# Patient Record
Sex: Female | Born: 1975
Health system: Southern US, Community
[De-identification: ages and names within clinical notes are randomized; demographics above are authoritative.]

## PROBLEM LIST (undated history)

## (undated) DIAGNOSIS — A630 Anogenital (venereal) warts: Secondary | ICD-10-CM

## (undated) DIAGNOSIS — R55 Syncope and collapse: Secondary | ICD-10-CM

## (undated) DIAGNOSIS — G43909 Migraine, unspecified, not intractable, without status migrainosus: Secondary | ICD-10-CM

## (undated) HISTORY — DX: Syncope and collapse: R55

## (undated) HISTORY — DX: Anogenital (venereal) warts: A63.0

## (undated) HISTORY — DX: Migraine, unspecified, not intractable, without status migrainosus: G43.909

---

## 2000-05-30 ENCOUNTER — Other Ambulatory Visit: Admission: RE | Admit: 2000-05-30 | Discharge: 2000-05-30 | Payer: Self-pay | Admitting: *Deleted

## 2001-06-02 ENCOUNTER — Other Ambulatory Visit: Admission: RE | Admit: 2001-06-02 | Discharge: 2001-06-02 | Payer: Self-pay | Admitting: *Deleted

## 2002-07-10 ENCOUNTER — Other Ambulatory Visit: Admission: RE | Admit: 2002-07-10 | Discharge: 2002-07-10 | Payer: Self-pay | Admitting: Obstetrics & Gynecology

## 2003-07-30 ENCOUNTER — Other Ambulatory Visit: Admission: RE | Admit: 2003-07-30 | Discharge: 2003-07-30 | Payer: Self-pay | Admitting: Obstetrics & Gynecology

## 2006-01-12 ENCOUNTER — Ambulatory Visit (HOSPITAL_COMMUNITY): Payer: Self-pay | Admitting: Psychiatry

## 2006-01-31 ENCOUNTER — Ambulatory Visit (HOSPITAL_COMMUNITY): Payer: Self-pay | Admitting: Psychiatry

## 2006-03-16 ENCOUNTER — Ambulatory Visit (HOSPITAL_COMMUNITY): Payer: Self-pay | Admitting: Psychiatry

## 2008-05-27 IMAGING — CT CT ABDOMEN W/O CM
3 of 7 series · 13 of 42 positions shown, 19 images · IV contrast (CONTRAST)
Comparison: NONE

CLINICAL DATA: Right upper quadrant pain. 

CT ABDOMEN WITHOUT AND WITH INTRAVENOUS CONTRAST
TECHNIQUE: Serial axial images were obtained through the 
abdomen.

[Series 3: arterial · axial · arterial · 0.72mm/px · z∈[+78,+258]mm · 5 of 56 slices shown]
[im 10/56  soft-tissue]
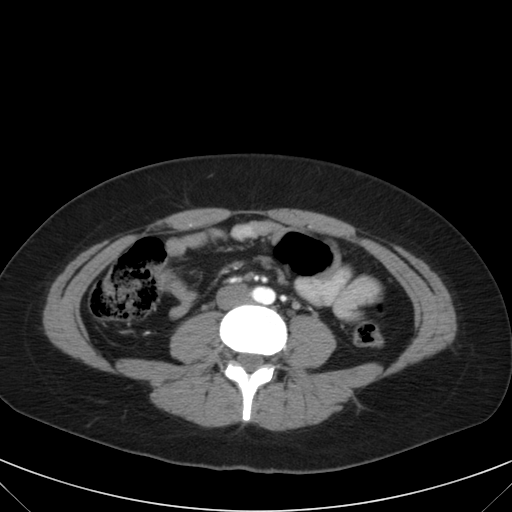
[im 19/56  soft-tissue]
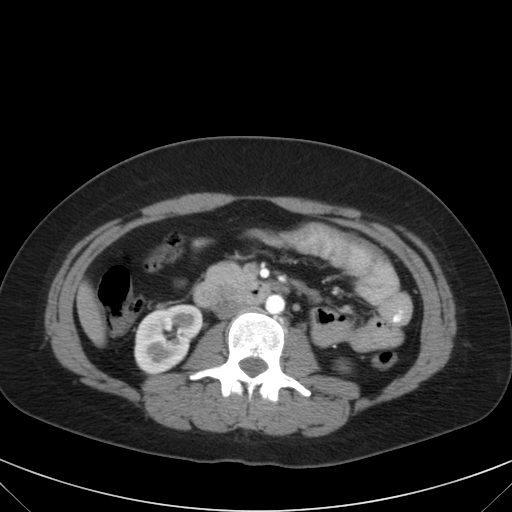
[im 28/56  soft-tissue]
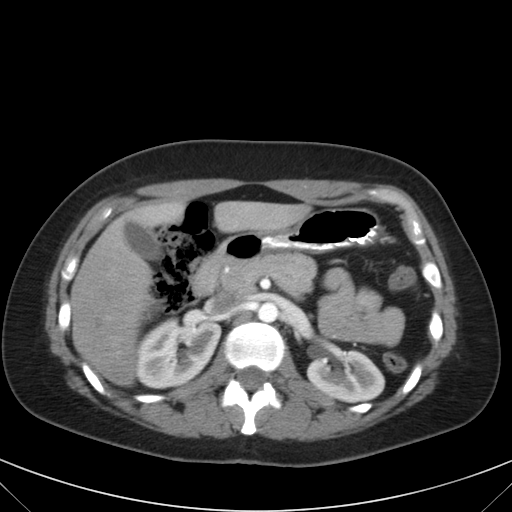
[im 37/56  soft-tissue]
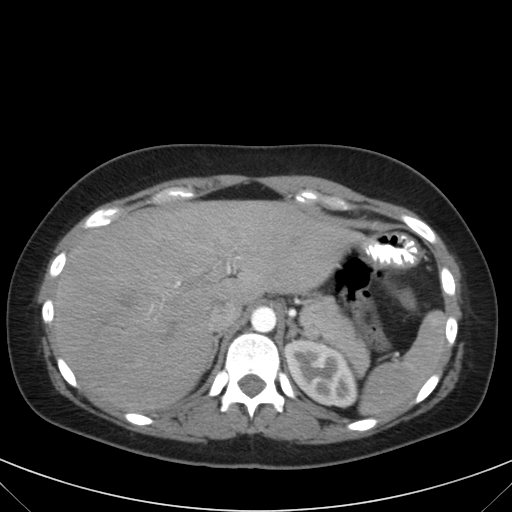
[im 46/56  soft-tissue]
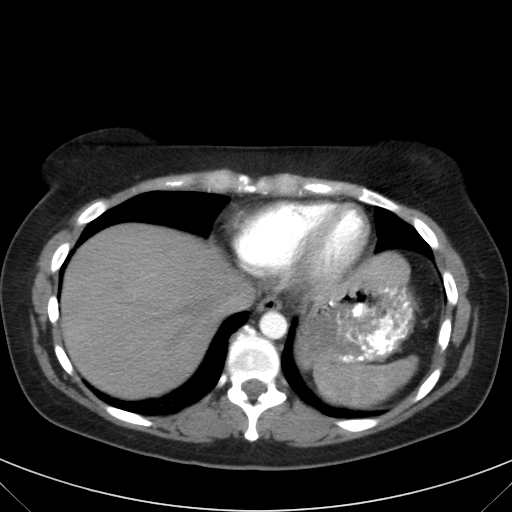

[Series 4: venous · axial · portal-venous · 0.74mm/px · z∈[+76,+256]mm · 5 of 56 slices shown, 10 images]
[im 10/56  soft-tissue]
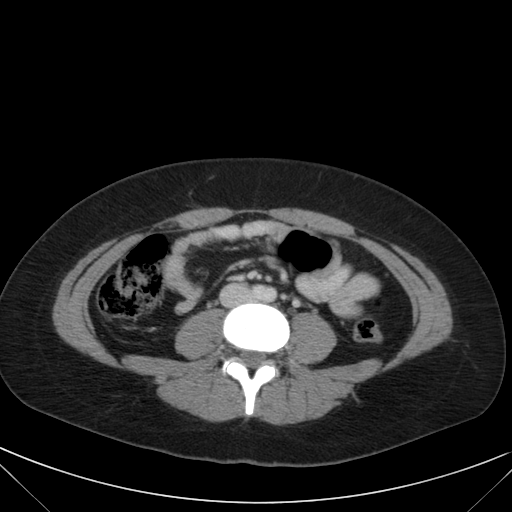
[im 10/56  bone]
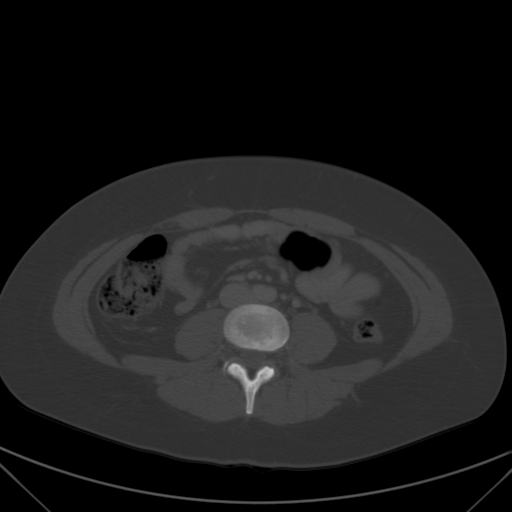
[im 19/56  soft-tissue]
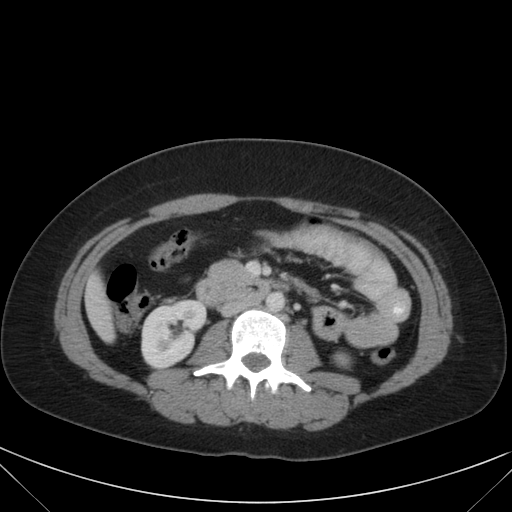
[im 19/56  lung]
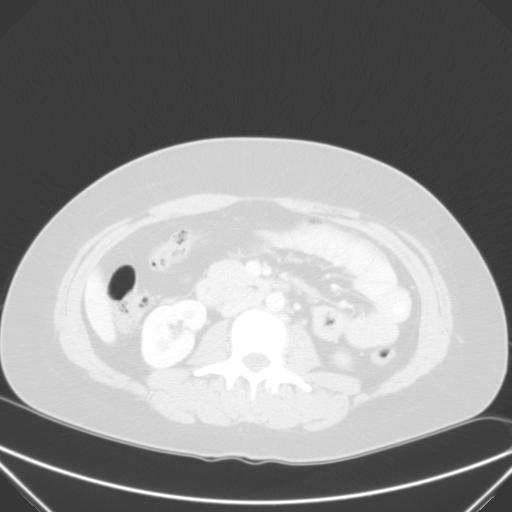
[im 28/56  soft-tissue]
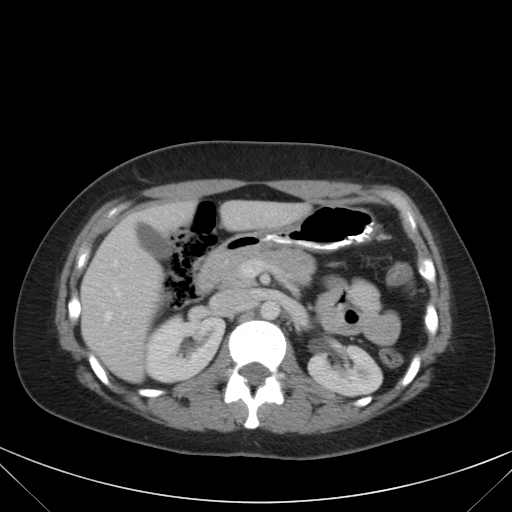
[im 28/56  lung]
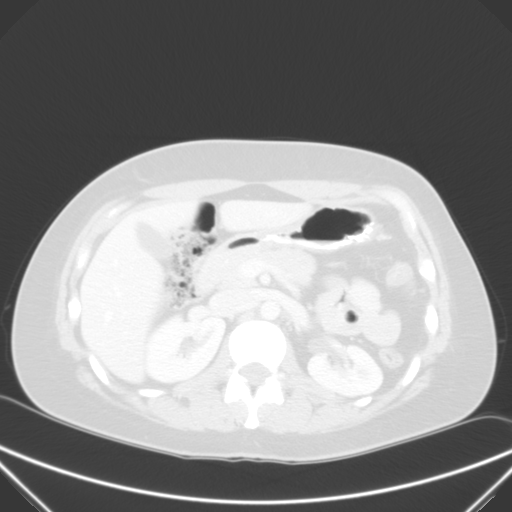
[im 37/56  soft-tissue]
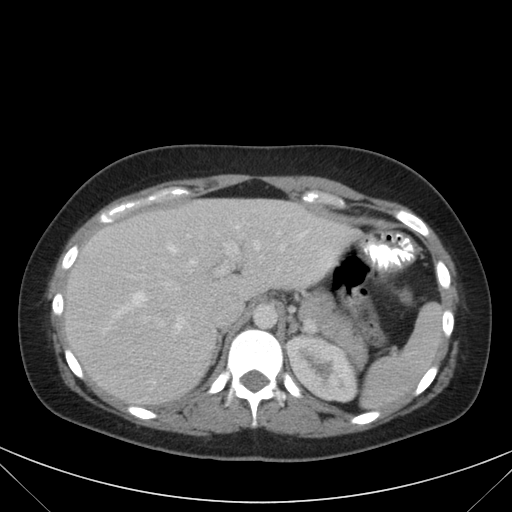
[im 37/56  lung]
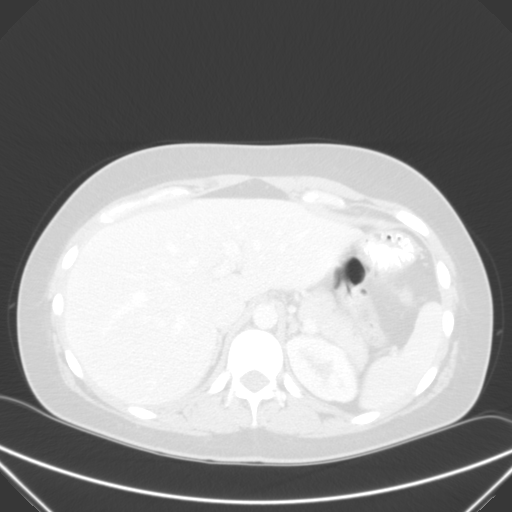
[im 46/56  soft-tissue]
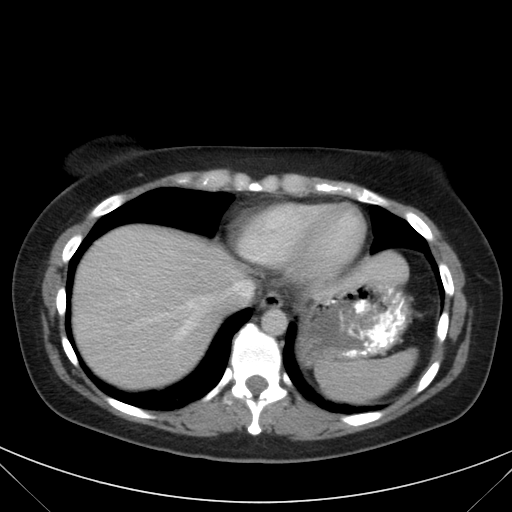
[im 46/56  lung]
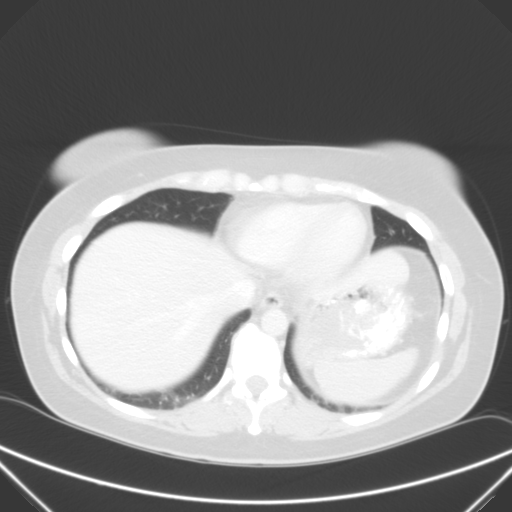

[coronals · coronal · 0.74mm/px · 3 of 62 slices shown, 4 images]
[im 21/62  soft-tissue]
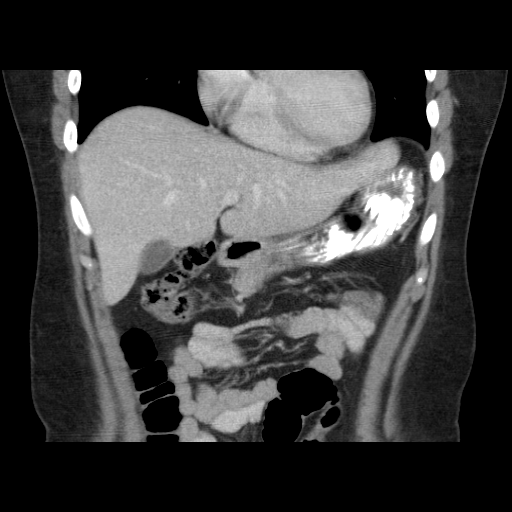
[im 28/62  soft-tissue]
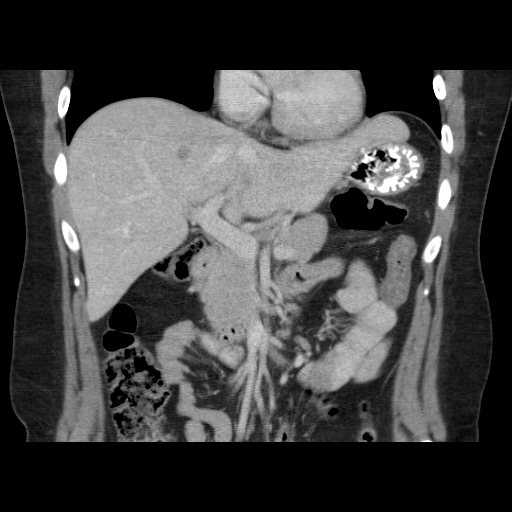
[im 28/62  bone]
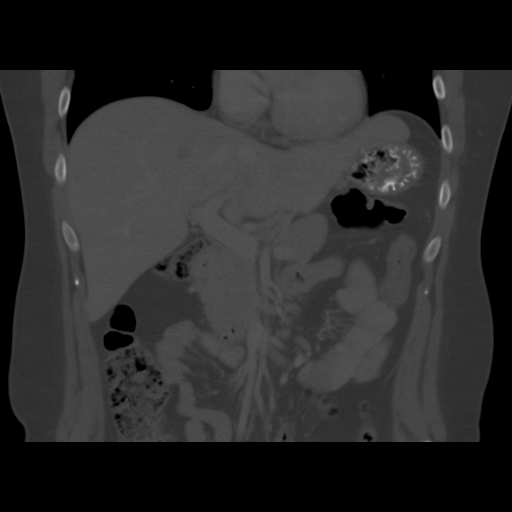
[im 34/62  soft-tissue]
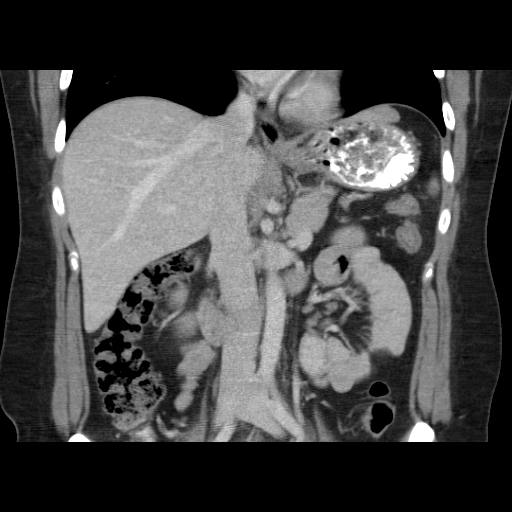

[13 of 42 positions shown; findings below may reference images not displayed]

FINDINGS: The heart size is within normal limits.  The visualized 
portions of the lung bases are within normal limits.  There is a 
1.3-cm-diameter, rounded hypodense lesion seen in the left lobe of 
the liver (image 14).  This is not seen on the delayed contrast 
images and likely represents a cavernous hemangioma.  There is no  
intrahepatic biliary dilatation.  The spleen, gallbladder, 
pancreas, adrenal glands are within normal limits.  Kidneys are 
within normal limits. No evidence of free fluid or aortic 
aneurysm.  No inflammatory changes are noted.  No evidence of 
lymphadenopathy.  No anterior abdominal wall hernia.  No abnormal 
osseous lesions are seen.
IMPRESSION: No evidence of hernia.  No abnormal soft tissue 
density mass is seen. Hypodense lesion seen in the left lobe of 
the liver, which is likely a cavernous hemangioma. Marikko Galling, 
Trans Date: 12/22/2006 JH  JLM

## 2015-08-28 ENCOUNTER — Ambulatory Visit (INDEPENDENT_AMBULATORY_CARE_PROVIDER_SITE_OTHER): Payer: Federal, State, Local not specified - PPO | Admitting: Primary Care

## 2015-08-28 ENCOUNTER — Encounter: Payer: Self-pay | Admitting: Primary Care

## 2015-08-28 VITALS — BP 124/84 | HR 77 | Temp 98.2°F | Ht 66.0 in | Wt 206.4 lb

## 2015-08-28 DIAGNOSIS — F329 Major depressive disorder, single episode, unspecified: Secondary | ICD-10-CM | POA: Insufficient documentation

## 2015-08-28 DIAGNOSIS — R5383 Other fatigue: Secondary | ICD-10-CM

## 2015-08-28 DIAGNOSIS — R51 Headache: Secondary | ICD-10-CM

## 2015-08-28 DIAGNOSIS — R519 Headache, unspecified: Secondary | ICD-10-CM

## 2015-08-28 DIAGNOSIS — F32A Depression, unspecified: Secondary | ICD-10-CM | POA: Insufficient documentation

## 2015-08-28 LAB — CBC
HCT: 45.1 % (ref 36.0–46.0)
Hemoglobin: 15.2 g/dL — ABNORMAL HIGH (ref 12.0–15.0)
MCHC: 33.7 g/dL (ref 30.0–36.0)
MCV: 87.5 fl (ref 78.0–100.0)
PLATELETS: 310 10*3/uL (ref 150.0–400.0)
RBC: 5.15 Mil/uL — ABNORMAL HIGH (ref 3.87–5.11)
RDW: 13.7 % (ref 11.5–15.5)
WBC: 11 10*3/uL — AB (ref 4.0–10.5)

## 2015-08-28 LAB — COMPREHENSIVE METABOLIC PANEL
ALBUMIN: 4.4 g/dL (ref 3.5–5.2)
ALK PHOS: 63 U/L (ref 39–117)
ALT: 14 U/L (ref 0–35)
AST: 15 U/L (ref 0–37)
BUN: 12 mg/dL (ref 6–23)
CO2: 27 mEq/L (ref 19–32)
Calcium: 9.3 mg/dL (ref 8.4–10.5)
Chloride: 105 mEq/L (ref 96–112)
Creatinine, Ser: 0.81 mg/dL (ref 0.40–1.20)
GFR: 83.1 mL/min (ref >60.00)
GLUCOSE: 87 mg/dL (ref 70–99)
POTASSIUM: 4.3 meq/L (ref 3.5–5.1)
Sodium: 137 mEq/L (ref 135–145)
TOTAL PROTEIN: 7.3 g/dL (ref 6.0–8.3)
Total Bilirubin: 0.5 mg/dL (ref 0.2–1.2)

## 2015-08-28 LAB — VITAMIN D 25 HYDROXY (VIT D DEFICIENCY, FRACTURES): VITD: 22.84 ng/mL — AB (ref 30.00–100.00)

## 2015-08-28 LAB — HEMOGLOBIN A1C: Hgb A1c MFr Bld: 5.6 % (ref 4.6–6.5)

## 2015-08-28 LAB — VITAMIN B12: Vitamin B-12: 216 pg/mL (ref 211–911)

## 2015-08-28 LAB — TSH: TSH: 2.89 u[IU]/mL (ref 0.35–4.50)

## 2015-08-28 NOTE — Progress Notes (Signed)
Pre visit review using our clinic review tool, if applicable. No additional management support is needed unless otherwise documented below in the visit note. 

## 2015-08-28 NOTE — Assessment & Plan Note (Signed)
Ongoing for years, no recent work up. Once treated on Wellbutrin "to increase energy levels". Unsure if mother has thyroid disease, does not know much about FH. Exam today unremarkable. Given symptoms, will order labs to rule out metabolic causes. Also suspect depression which could likely be cause if labs normal. PHQ 9 score of 9 today. Denies SI/HI. Discussed options for treatment, she will think about options.

## 2015-08-28 NOTE — Progress Notes (Signed)
Subjective:    Patient ID: Heather Hutchinson, female    DOB: January 09, 1976, 40 y.o.   MRN: [id number]  HPI  Ms. Heather Hutchinson is a 40 year old female who presents today to establish care and discuss the problems mentioned below. Will obtain old records. Her last physical was numerous years ago.   1) Fatigue: Ongoing for years and once managed on Wellbutrin in the past to help increase energy levels. She feels like she could always sleep. She will run errands and feel exhausted, requiring naps during the weekend. She will get 7 hours of sleep every night and night and denies insomnia. She also reports hair thinning, difficulty losing weight, and cold intolerance. He believes her mother has "thyroid problems" but is uncertain. Overall her symptoms are no worse, but no better.   She also feels as though she's not excited to do anything she once enjoyed doing, and has felt this way for years. She has no prior diagnosis of depression, once diagnosed with OCD and provided with medication in the past. She felt the medication made her feel tired and more fatigued. PHQ 9 score of 9 today. Denies anxiety, SI/HI.  2) Near Syncope: Will experience weakness, fatigue, and clammy if she goes longer than 4 hours without eating. She had 1 episode of syncope 20 years ago. No recent episodes.   3) Frequent Headaches: Chronic for years. Headaches occur mostly on the weekends, less frequently overall than before. Typically located to the bilateral temporal region. Denies nausea, does report photophobia. She will take ibuprofen as needed for headaches with improvement.    Review of Systems  Constitutional: Positive for fatigue. Negative for unexpected weight change.  Respiratory: Negative for shortness of breath.   Cardiovascular: Negative for chest pain.  Genitourinary:       Follows with GYN annually  Neurological: Negative for dizziness and syncope.       Occasional headaches  Psychiatric/Behavioral:       See HPI        Past Medical History  Diagnosis Date  . Migraines   . Syncope   . Genital warts      Social History   Social History  . Marital Status: Single    Spouse Name: N/A  . Number of Children: N/A  . Years of Education: N/A   Occupational History  . Not on file.   Social History Main Topics  . Smoking status: Current Every Day Smoker -- 0.50 packs/day    Types: Cigarettes  . Smokeless tobacco: Not on file  . Alcohol Use: No  . Drug Use: No  . Sexual Activity: Not on file   Other Topics Concern  . Not on file   Social History Narrative   Single.   No children.   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   Enjoys spending time with her dogs.        No past surgical history on file.  Family History  Problem Relation Age of Onset  . Sudden death Brother     No Known Allergies  No current outpatient prescriptions on file prior to visit.   No current facility-administered medications on file prior to visit.    BP 124/84 mmHg  Pulse 77  Temp(Src) 98.2 F (36.8 C) (Oral)  Ht 5\' 6"  (1.676 m)  Wt 206 lb 6.4 oz (93.622 kg)  BMI 33.33 kg/m2  SpO2 98%  LMP 08/18/2015    Objective:   Physical Exam  Constitutional: She appears well-nourished.  Neck: Neck  supple.  Cardiovascular: Normal rate and regular rhythm.   Pulmonary/Chest: Effort normal and breath sounds normal.  Skin: Skin is warm and dry.  Psychiatric: She has a normal mood and affect.  Mild tearfulness when discussing possible depression          Assessment & Plan:

## 2015-08-28 NOTE — Assessment & Plan Note (Signed)
Chronic, overall improved. Occur during weekends to bilateral temporal region. Improvement with ibuprofen. No migraines.

## 2015-08-28 NOTE — Patient Instructions (Signed)
Complete lab work prior to leaving today. I will notify you of your results once received.   I believe you may have a mild case of depression. Think about the options discussed today and notify me if you'd like to pursue treatment.  It was a pleasure to meet you today! Please don't hesitate to call me with any questions. Welcome to [facility]!

## 2015-09-16 ENCOUNTER — Telehealth: Payer: Self-pay | Admitting: *Deleted

## 2015-09-16 DIAGNOSIS — F32A Depression, unspecified: Secondary | ICD-10-CM

## 2015-09-16 DIAGNOSIS — F329 Major depressive disorder, single episode, unspecified: Secondary | ICD-10-CM

## 2015-09-16 MED ORDER — SERTRALINE HCL 50 MG PO TABS: 50.0000 mg | ORAL_TABLET | Freq: Every day | ORAL | Status: DC

## 2015-09-16 NOTE — Telephone Encounter (Signed)
Noted. A prescription for Zoloft has been sent to her pharmacy. She is to start out by taking one half tablet by mouth twice daily for 10 days, then advance to one full tablet thereafter. Please ensure that she follows those instructions.  Please set her up for follow-up with me in 6 weeks for reevaluation. Please have her notify me if she develops symptoms of suicidal thoughts which are rare but could occur.

## 2015-09-16 NOTE — Telephone Encounter (Signed)
Patient left a voicemail stating that she was in about a month ago and you talked with her about starting Zoloft. Patient stated that she has done some research on this and would like a script called to [facility] Patient stated that she needs to pick something else up at the pharmacy and request a call back when this has been done.

## 2015-09-16 NOTE — Telephone Encounter (Signed)
Spoken and notified patient of [name]'s comments. Patient verbalized understanding.  Patient will call back to schedule to 6 weeks follow up.

## 2015-09-25 NOTE — Telephone Encounter (Addendum)
Pt left v/m; pt started taking Zoloft 50 mg 1/2 tab bid on 09/17/15; pt said she had bad h/as, dry mouth and foggy feeling and feels tired. On 09/20/15 pt started taking Zoloft 50 mg 1/2 pill at night; some symptoms better but this morning had slight h/a, still feels foggy and dry mouth and feels tired. Pt wants to know if should continue taking 1/2 pill daily or what to do. Pt request cb. [facility].

## 2015-09-25 NOTE — Telephone Encounter (Signed)
Please have patient only take one half tablet every night at bedtime. Please have her do this for another week and notify me if no improvement. The headache should subside.

## 2015-09-25 NOTE — Telephone Encounter (Signed)
Spoken and notified patient of [name]'s comments. Patient verbalized understanding. 

## 2015-11-05 DIAGNOSIS — Z23 Encounter for immunization: Secondary | ICD-10-CM | POA: Diagnosis not present

## 2015-11-05 DIAGNOSIS — S61230A Puncture wound without foreign body of right index finger without damage to nail, initial encounter: Secondary | ICD-10-CM | POA: Diagnosis not present

## 2015-12-10 ENCOUNTER — Other Ambulatory Visit: Payer: Self-pay | Admitting: Primary Care

## 2015-12-10 DIAGNOSIS — F329 Major depressive disorder, single episode, unspecified: Secondary | ICD-10-CM

## 2015-12-10 DIAGNOSIS — F32A Depression, unspecified: Secondary | ICD-10-CM

## 2015-12-10 NOTE — Telephone Encounter (Signed)
Ok to refill? Electronically refill request for sertraline (ZOLOFT) 50 MG tablet.  Last prescribed on 09/16/2015. Last seen on 08/28/2015. No future appt.

## 2015-12-11 NOTE — Telephone Encounter (Signed)
Message left for patient to return my call.  

## 2015-12-12 NOTE — Telephone Encounter (Signed)
Message left for patient to return my call.  

## 2015-12-30 DIAGNOSIS — J00 Acute nasopharyngitis [common cold]: Secondary | ICD-10-CM | POA: Diagnosis not present

## 2016-01-02 ENCOUNTER — Ambulatory Visit (INDEPENDENT_AMBULATORY_CARE_PROVIDER_SITE_OTHER): Payer: Federal, State, Local not specified - PPO | Admitting: Primary Care

## 2016-01-02 ENCOUNTER — Encounter: Payer: Self-pay | Admitting: Primary Care

## 2016-01-02 DIAGNOSIS — F329 Major depressive disorder, single episode, unspecified: Secondary | ICD-10-CM | POA: Diagnosis not present

## 2016-01-02 DIAGNOSIS — F32A Depression, unspecified: Secondary | ICD-10-CM

## 2016-01-02 MED ORDER — SERTRALINE HCL 50 MG PO TABS: 50.0000 mg | ORAL_TABLET | Freq: Every day | ORAL | 3 refills | Status: DC

## 2016-01-02 NOTE — Progress Notes (Signed)
Pre visit review using our clinic review tool, if applicable. No additional management support is needed unless otherwise documented below in the visit note. 

## 2016-01-02 NOTE — Progress Notes (Signed)
Subjective:    Patient ID: Heather Hutchinson, female    DOB: [birth year], 40 y.o.   MRN: [id number]  HPI  Heather Hutchinson is a 40 year old female who presents today for follow up.  1) Nasal Congestion: She was evaluated at Urgent Care on Tuesday this week for complaints of cough, chest and nasal congestion, ear pain. Her symptoms began Monday this week. She was treated for a viral URI and provided with Benzonotate capsules. Overall she's feeling much improved but would like evaluation of her ear as they have been painful over the past several days.  2) Depression: Managed on Zoloft 25 mg that was initiated in May 2017 after further evaluation of symptoms of fatigue, decreased interest in doing things she enjoyed. Her PHQ 9 score was 9 that day. She was reticent to go on medication but called back and decided to move forward with treatment. She was placed on Zoloft 50 mg for symptoms.    Since her last visit she's been taking 25 mg due to side effects of headaches, feeling "odd and out of my head", but those symptoms have since passed and she will be increasing her dose to 1 full tablet son. She's noticed improvements in her mood, she is more interested in doing activities she enjoys doing. She and her husband are camping more frequently. Denies SI/HI, headaches, Gi upset.   Review of Systems  Constitutional: Negative for chills and fever.  HENT: Positive for congestion and ear pain. Negative for sore throat.   Respiratory: Negative for cough and shortness of breath.   Gastrointestinal: Negative for nausea.  Neurological: Negative for headaches.  Psychiatric/Behavioral: Negative for sleep disturbance and suicidal ideas. The patient is not nervous/anxious.        Past Medical History:  Diagnosis Date  . Genital warts   . Migraines   . Syncope      Social History   Social History  . Marital status: Single    Spouse name: N/A  . Number of children: N/A  . Years of education: N/A    Occupational History  . Not on file.   Social History Main Topics  . Smoking status: Current Every Day Smoker    Packs/day: 0.50    Types: Cigarettes  . Smokeless tobacco: Not on file  . Alcohol use No  . Drug use: No  . Sexual activity: Not on file   Other Topics Concern  . Not on file   Social History Narrative   Single.   No children.   Works in IT trainer.   Enjoys spending time with her dogs.        No past surgical history on file.  Family History  Problem Relation Age of Onset  . Sudden death Brother     No Known Allergies  Current Outpatient Prescriptions on File Prior to Visit  Medication Sig Dispense Refill  . CAMILA 0.35 MG tablet Take 1 tablet by mouth daily.  1   No current facility-administered medications on file prior to visit.     BP 124/86   Pulse 74   Temp 98.3 F (36.8 C) (Oral)   Ht 5\' 6"  (1.676 m)   Wt 203 lb 1.9 oz (92.1 kg)   LMP 12/12/2015   SpO2 96%   BMI 32.78 kg/m    Objective:   Physical Exam  Constitutional: She appears well-nourished.  HENT:  Right Ear: Ear canal normal. Tympanic membrane is not erythematous and not bulging.  Left Ear:  Ear canal normal. Tympanic membrane is not erythematous and not bulging.  Nose: Right sinus exhibits no maxillary sinus tenderness and no frontal sinus tenderness. Left sinus exhibits no maxillary sinus tenderness and no frontal sinus tenderness.  Mouth/Throat: Oropharynx is clear and moist.  Mild effusion to bilateral TM's. No erythema or bulging.  Eyes: Conjunctivae are normal.  Neck: Neck supple.  Cardiovascular: Normal rate and regular rhythm.   Pulmonary/Chest: Effort normal and breath sounds normal. She has no wheezes. She has no rales.  Lymphadenopathy:    She has no cervical adenopathy.  Skin: Skin is warm and dry.  Psychiatric: She has a normal mood and affect.          Assessment & Plan:  Viral URI:  Cough, fatigue, ear pain, congestion since Monday this  week. Overall feeling much improved. Benzonatate capsules helping. Exam mostly unremarkable. Lungs clear. Mild effusion to bilateral TM's. Continue Benzonatate PRN.   Morrie Sheldonlark,Katherine Kendal, NP

## 2016-01-02 NOTE — Assessment & Plan Note (Addendum)
Improved with Zoloft.  Taking 1/2 tablet due to s/e of headaches, but will be increasing to 1 full tablet now that symptoms have resolved. Much improved since last visit. Denies SI/HI. Continue same.

## 2016-01-02 NOTE — Patient Instructions (Signed)
I sent refills of Zoloft to your pharmacy.   Please schedule a physical with me in 6 months. You may also schedule a lab only appointment 3-4 days prior. We will discuss your lab results in detail during your physical.  It was a pleasure to see you today!

## 2016-01-28 DIAGNOSIS — Z23 Encounter for immunization: Secondary | ICD-10-CM | POA: Diagnosis not present

## 2016-03-24 ENCOUNTER — Telehealth: Payer: Self-pay | Admitting: Primary Care

## 2016-03-24 NOTE — Telephone Encounter (Signed)
Noted and agree with triage decision.

## 2016-03-24 NOTE — Telephone Encounter (Signed)
PLEASE NOTE: All timestamps contained within this report are represented as [place] Time. CONFIDENTIALTY NOTICE: This fax transmission is intended only for the addressee. It contains information that is legally privileged, confidential or otherwise protected from use or disclosure. If you are not the intended recipient, you are strictly prohibited from reviewing, disclosing, copying using or disseminating any of this information or taking any action in reliance on or regarding this information. If you have received this fax in error, please notify us immediately by telephone so that we can arrange for its return to us. Phone: [phone], Toll-Free: [phone], Fax: [phone] Page: 1 of 2 Call Id: [id number] [facility] - Client TELEPHONE ADVICE RECORD [facility] Gender: Female DOB: 11-08-1975 Age: [age] Y 10 M 21 D Return Phone Number: [phone] (Primary) Address: [place]: [place] [id number] Client [facility] Day Physician Vernona Riegerlark, Katherine - NP Contact Type Call Who Is Calling Patient / Member / Family / Caregiver Call Type Triage / Clinical Relationship To Patient Self Return Phone Number [phone] (Primary) Chief Complaint Arm Pain (no known cause) Reason for Call Symptomatic / Request for Health Information Initial Comment Caller says, her RT arm has a sudden sharp pain today while working, this lasted for 45 min, then has gotten better, it is still sore. Appointment Disposition EMR Appointment Not Necessary Info pasted into Epic Yes PreDisposition Call Doctor Translation No Nurse Assessment Nurse: Lane HackerHarley, RN, Elvin SoWindy Date/Time Lamount Cohen(Eastern Time): 03/24/2016 1:21:15 PM Confirm and document reason for call. If symptomatic, describe symptoms. ---Caller states that her right arm has a sudden sharp pain  today while working, this lasted for 45 min. Felt with moving her mouse, or lifting her arm up. The has gotten better, it is still sore. Rates pain now at 1/10. Does the patient have any new or worsening symptoms? ---Yes Will a triage be completed? ---Yes Related visit to physician within the last 2 weeks? ---No Does the PT have any chronic conditions? (i.e. diabetes, asthma, etc.) ---No Is the patient pregnant or possibly pregnant? (Ask all females between the ages of 7612-55) ---No Is this a behavioral health or substance abuse call? ---No Guidelines Guideline Title Affirmed Question Affirmed Notes Nurse Date/Time Lamount Cohen(Eastern Time) Arm Pain Arm pain (all triage questions negative) Lane HackerHarley, RN, Elvin SoWindy 03/24/2016 1:23:24 PM Disp. Time Lamount Cohen(Eastern Time) Disposition Final User 03/24/2016 1:27:19 PM Home Care Yes Lane HackerHarley, RN, [place]indy PLEASE NOTE: All timestamps contained within this report are represented as [place] Standard Time. CONFIDENTIALTY NOTICE: This fax transmission is intended only for the addressee. It contains information that is legally privileged, confidential or otherwise protected from use or disclosure. If you are not the intended recipient, you are strictly prohibited from reviewing, disclosing, copying using or disseminating any of this information or taking any action in reliance on or regarding this information. If you have received this fax in error, please notify us immediately by telephone so that we can arrange for its return to us. Phone: [phone], Toll-Free: [phone], Fax: [phone] Page: 2 of 2 Call Id: [id number] Caller Understands: Yes Disagree/Comply: Comply Care Advice Given Per Guideline HOME CARE: You should be able to treat this at home. PAIN MEDICINES: * For pain relief, take acetaminophen, ibuprofen, or naproxen. * Before taking any medicine, read all the instructions on the package. CALL BACK IF: * Moderate pain (e.g., interferes with normal  activities) lasts over 3  days * Mild pain lasts over 7 days * Arm swelling occurs * Signs of infection occur (e.g., spreading redness, warmth, fever) * You become worse. CARE ADVICE given per Arm Pain (Adult) guideline. Comments User: Rubie MaidWindy, Harley, RN Date/Time Lamount Cohen(Eastern Time): 03/24/2016 1:30:38 PM RN advised speaking to supervisor in case this is work related. Caller verb understanding.

## 2016-03-24 NOTE — Telephone Encounter (Signed)
[facility] Client TELEPHONE ADVICE RECORD [facility] DOB: [birth year] Initial Comment Caller says, her RT arm has a sudden sharp pain today while working, this lasted for 45 min, then has gotten better, it is still sore. Nurse Assessment Nurse: Lane HackerHarley, RN, Elvin SoWindy Date/Time (Eastern Time): 03/24/2016 1:21:15 PM Confirm and document reason for call. If symptomatic, describe symptoms. ---Caller states that her right arm has a sudden sharp pain today while working, this lasted for 45 min. Felt with moving her mouse, or lifting her arm up. The has gotten better, it is still sore. Rates pain now at 1/10. Does the patient have any new or worsening symptoms? ---Yes Will a triage be completed? ---Yes Related visit to physician within the last 2 weeks? ---No Does the PT have any chronic conditions? (i.e. diabetes, asthma, etc.) ---No Is the patient pregnant or possibly pregnant? (Ask all females between the ages of 3012-55) ---No Is this a behavioral health or substance abuse call? ---No Guidelines Guideline Title Affirmed Question Affirmed Notes Arm Pain Arm pain (all triage questions negative) Final Disposition User Home Care SardisHarley, RN, Elvin SoWindy Disagree/Comply: Comply RN advised speaking to supervisor in case this is work related.

## 2016-07-08 ENCOUNTER — Telehealth: Payer: Federal, State, Local not specified - PPO | Admitting: Physician Assistant

## 2016-07-08 DIAGNOSIS — J329 Chronic sinusitis, unspecified: Secondary | ICD-10-CM

## 2016-07-08 DIAGNOSIS — B9789 Other viral agents as the cause of diseases classified elsewhere: Secondary | ICD-10-CM

## 2016-07-08 MED ORDER — FLUTICASONE PROPIONATE 50 MCG/ACT NA SUSP: 2.0000 | Freq: Every day | NASAL | 0 refills | Status: DC

## 2016-07-08 NOTE — Progress Notes (Signed)

## 2016-08-06 ENCOUNTER — Encounter: Payer: Self-pay | Admitting: Internal Medicine

## 2016-08-06 ENCOUNTER — Ambulatory Visit (INDEPENDENT_AMBULATORY_CARE_PROVIDER_SITE_OTHER): Payer: Federal, State, Local not specified - PPO | Admitting: Internal Medicine

## 2016-08-06 VITALS — BP 122/78 | HR 72 | Temp 98.1°F | Wt 216.0 lb

## 2016-08-06 DIAGNOSIS — L02415 Cutaneous abscess of right lower limb: Secondary | ICD-10-CM | POA: Diagnosis not present

## 2016-08-06 MED ORDER — SULFAMETHOXAZOLE-TRIMETHOPRIM 800-160 MG PO TABS: 1.0000 | ORAL_TABLET | Freq: Two times a day (BID) | ORAL | 0 refills | Status: DC

## 2016-08-06 NOTE — Patient Instructions (Signed)

## 2016-08-06 NOTE — Progress Notes (Signed)
Subjective:    Patient ID: Heather Hutchinson, female    DOB: 1975-08-04, 41 y.o.   MRN: [id number]  HPI  Pt presents to the clinic today with c/o a bump on her inner thigh. She noticed this 1 month ago. It was big at one point, then resolved. Within the last 4-5 days, it has gotten bigger in size. The area is hard but tender. She has not noticed any warmth or drainage from the area. She denies fever, chills or body aches. She has tried warm compresses with some relief.   Review of Systems      Past Medical History:  Diagnosis Date  . Genital warts   . Migraines   . Syncope     Current Outpatient Prescriptions  Medication Sig Dispense Refill  . CAMILA 0.35 MG tablet Take 1 tablet by mouth daily.  1  . fluticasone (FLONASE) 50 MCG/ACT nasal spray Place 2 sprays into both nostrils daily. 16 g 0  . sertraline (ZOLOFT) 50 MG tablet Take 1 tablet (50 mg total) by mouth daily. 90 tablet 3   No current facility-administered medications for this visit.     No Known Allergies  Family History  Problem Relation Age of Onset  . Sudden death Brother     Social History   Social History  . Marital status: Single    Spouse name: N/A  . Number of children: N/A  . Years of education: N/A   Occupational History  . Not on file.   Social History Main Topics  . Smoking status: Current Every Day Smoker    Packs/day: 0.50    Types: Cigarettes  . Smokeless tobacco: Not on file  . Alcohol use No  . Drug use: No  . Sexual activity: Not on file   Other Topics Concern  . Not on file   Social History Narrative   Single.   No children.   Works in IT trainer.   Enjoys spending time with her dogs.         Constitutional: Denies fever, malaise, fatigue, headache or abrupt weight changes.  Skin: Pt reports bump of right thigh. Denies  rashes or ulcercations.    No other specific complaints in a complete review of systems (except as listed in HPI above).  Objective:   Physical Exam  BP 122/78   Pulse 72   Temp 98.1 F (36.7 C) (Oral)   Wt 216 lb (98 kg)   SpO2 97%   BMI 34.86 kg/m  Wt Readings from Last 3 Encounters:  08/06/16 216 lb (98 kg)  01/02/16 203 lb 1.9 oz (92.1 kg)  08/28/15 206 lb 6.4 oz (93.6 kg)    General: Appears her stated age,  in NAD. Skin: 3 cm by 1.5 cm area of induration. Area is hard, not fluctuant. No cellulitis noted.   BMET    Component Value Date/Time   NA 137 08/28/2015 0908   K 4.3 08/28/2015 0908   CL 105 08/28/2015 0908   CO2 27 08/28/2015 0908   GLUCOSE 87 08/28/2015 0908   BUN 12 08/28/2015 0908   CREATININE 0.81 08/28/2015 0908   CALCIUM 9.3 08/28/2015 0908    Lipid Panel  No results found for: CHOL, TRIG, HDL, CHOLHDL, VLDL, LDLCALC  CBC    Component Value Date/Time   WBC 11.0 (H) 08/28/2015 0908   RBC 5.15 (H) 08/28/2015 0908   HGB 15.2 (H) 08/28/2015 0908   HCT 45.1 08/28/2015 0908   PLT 310.0 08/28/2015 0908  MCV 87.5 08/28/2015 0908   MCHC 33.7 08/28/2015 0908   RDW 13.7 08/28/2015 0908    Hgb A1C Lab Results  Component Value Date   HGBA1C 5.6 08/28/2015            Assessment & Plan:   Abscess of Right Thigh:  Warm compresses TID Septra DS 1 tab BID x 10 days  Return precautions discussed Nicki Reaper, NP

## 2016-08-09 ENCOUNTER — Encounter: Payer: Self-pay | Admitting: Internal Medicine

## 2016-08-13 DIAGNOSIS — Z1231 Encounter for screening mammogram for malignant neoplasm of breast: Secondary | ICD-10-CM | POA: Diagnosis not present

## 2016-08-13 DIAGNOSIS — Z01419 Encounter for gynecological examination (general) (routine) without abnormal findings: Secondary | ICD-10-CM | POA: Diagnosis not present

## 2016-08-13 DIAGNOSIS — Z6835 Body mass index (BMI) 35.0-35.9, adult: Secondary | ICD-10-CM | POA: Diagnosis not present

## 2016-10-27 ENCOUNTER — Other Ambulatory Visit: Payer: Self-pay | Admitting: Primary Care

## 2016-10-27 DIAGNOSIS — E559 Vitamin D deficiency, unspecified: Secondary | ICD-10-CM

## 2016-10-27 DIAGNOSIS — Z0000 Encounter for general adult medical examination without abnormal findings: Secondary | ICD-10-CM

## 2016-10-27 DIAGNOSIS — E538 Deficiency of other specified B group vitamins: Secondary | ICD-10-CM

## 2016-11-05 ENCOUNTER — Other Ambulatory Visit (INDEPENDENT_AMBULATORY_CARE_PROVIDER_SITE_OTHER): Payer: Federal, State, Local not specified - PPO

## 2016-11-05 DIAGNOSIS — E538 Deficiency of other specified B group vitamins: Secondary | ICD-10-CM | POA: Diagnosis not present

## 2016-11-05 DIAGNOSIS — E559 Vitamin D deficiency, unspecified: Secondary | ICD-10-CM | POA: Diagnosis not present

## 2016-11-05 DIAGNOSIS — Z0000 Encounter for general adult medical examination without abnormal findings: Secondary | ICD-10-CM

## 2016-11-05 LAB — LIPID PANEL
CHOL/HDL RATIO: 6
Cholesterol: 163 mg/dL (ref 0–200)
HDL: 28.2 mg/dL — AB (ref >39.00)
LDL CALC: 102 mg/dL — AB (ref 0–99)
NONHDL: 134.92
TRIGLYCERIDES: 164 mg/dL — AB (ref 0.0–149.0)
VLDL: 32.8 mg/dL (ref 0.0–40.0)

## 2016-11-05 LAB — VITAMIN D 25 HYDROXY (VIT D DEFICIENCY, FRACTURES): VITD: 39.86 ng/mL (ref 30.00–100.00)

## 2016-11-05 LAB — VITAMIN B12: Vitamin B-12: 334 pg/mL (ref 211–911)

## 2016-11-09 ENCOUNTER — Ambulatory Visit (INDEPENDENT_AMBULATORY_CARE_PROVIDER_SITE_OTHER): Payer: Federal, State, Local not specified - PPO | Admitting: Primary Care

## 2016-11-09 ENCOUNTER — Encounter: Payer: Self-pay | Admitting: Primary Care

## 2016-11-09 ENCOUNTER — Other Ambulatory Visit: Payer: Federal, State, Local not specified - PPO

## 2016-11-09 VITALS — BP 122/80 | HR 82 | Temp 98.1°F | Ht 66.0 in | Wt 213.1 lb

## 2016-11-09 DIAGNOSIS — L0291 Cutaneous abscess, unspecified: Secondary | ICD-10-CM | POA: Diagnosis not present

## 2016-11-09 DIAGNOSIS — Z0001 Encounter for general adult medical examination with abnormal findings: Secondary | ICD-10-CM | POA: Insufficient documentation

## 2016-11-09 DIAGNOSIS — F3342 Major depressive disorder, recurrent, in full remission: Secondary | ICD-10-CM

## 2016-11-09 DIAGNOSIS — R1011 Right upper quadrant pain: Secondary | ICD-10-CM | POA: Diagnosis not present

## 2016-11-09 DIAGNOSIS — R2243 Localized swelling, mass and lump, lower limb, bilateral: Secondary | ICD-10-CM | POA: Diagnosis not present

## 2016-11-09 DIAGNOSIS — Z0000 Encounter for general adult medical examination without abnormal findings: Secondary | ICD-10-CM | POA: Insufficient documentation

## 2016-11-09 MED ORDER — SULFAMETHOXAZOLE-TRIMETHOPRIM 800-160 MG PO TABS: 1.0000 | ORAL_TABLET | Freq: Two times a day (BID) | ORAL | 0 refills | Status: DC

## 2016-11-09 NOTE — Progress Notes (Signed)
Subjective:    Patient ID: Heather Hutchinson, female    DOB: 04-Mar-1976, 41 y.o.   MRN: [id number]  HPI  Heather Hutchinson is a 41 year old female who presents today for complete physical and for the problems mentioned below.  1) Abscess: Treated in April 2018. Abscess improved but never went away. Worse over the past 2-3 weeks, noticed whitish green drainage. Located to right medial upper thigh. She denies fevers, pain.   2) Abdominal Pain: Intermittent to RUQ for years. CT scan 10 years ago revealed liver mass. Pain will come in flares that will last 3-4 days, flares occur every 1-2 months. This occurs with rest mostly. Denies symptoms with eating, exercise. Denies nausea, vomiting, fatigue, constipation, diarrhea, esophageal reflux.  3) Foot Masses: Located to bilateral lateral feet for the past 3-4 weeks. Denies pain, wearing tight fitting shoes. She's mostly wearing open shoes such as sandals/flip-flops.   Immunizations: -Tetanus: Completed in 2017 -Influenza: Completes annually.   Diet: She endorses a fair diet. One week ago she started improving her diet. Breakfast: Boiled eggs, cheese (pop tarts, frozen biscuits before) Lunch: Sandwiches with healthy ingredients (restaurants/fast food before) Dinner: Tacos, grilled/baked chicken, pasta, meat loaf, some vegetables.  Snacks: Chips, snack cakes, cookies Desserts: Daily Beverages: Iced coffee, water, diet soda  Exercise: She does not currently exercise. Eye exam: Completed two years ago. Dental exam: Completes semi-annually. Pap Smear: Completed in 2018, normal. Mammogram: Completed in 2018, normal.   Review of Systems  Constitutional: Negative for unexpected weight change.  HENT: Negative for rhinorrhea.   Respiratory: Negative for cough and shortness of breath.   Cardiovascular: Negative for chest pain.  Gastrointestinal: Negative for constipation and diarrhea.  Genitourinary: Negative for difficulty urinating and menstrual  problem.  Musculoskeletal: Negative for arthralgias and myalgias.  Skin: Positive for color change and wound. Negative for rash.       Abscess to right medial upper thigh. Masses to bilateral lateral foot next to 5th digit.  Allergic/Immunologic: Negative for environmental allergies.  Neurological: Negative for dizziness, numbness and headaches.  Psychiatric/Behavioral:       Doing well on Zoloft.       Past Medical History:  Diagnosis Date  . Genital warts   . Migraines   . Syncope      Social History   Social History  . Marital status: Single    Spouse name: N/A  . Number of children: N/A  . Years of education: N/A   Occupational History  . Not on file.   Social History Main Topics  . Smoking status: Current Every Day Smoker    Packs/day: 0.50    Types: Cigarettes  . Smokeless tobacco: Never Used  . Alcohol use No  . Drug use: No  . Sexual activity: Not on file   Other Topics Concern  . Not on file   Social History Narrative   Single.   No children.   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   Enjoys spending time with her dogs.        No past surgical history on file.  Family History  Problem Relation Age of Onset  . Sudden death Brother     No Known Allergies  Current Outpatient Prescriptions on File Prior to Visit  Medication Sig Dispense Refill  . CAMILA 0.35 MG tablet Take 1 tablet by mouth daily.  1  . fluticasone (FLONASE) 50 MCG/ACT nasal spray Place 2 sprays into both nostrils daily. 16 g 0  . sertraline (ZOLOFT)  50 MG tablet Take 1 tablet (50 mg total) by mouth daily. 90 tablet 3   No current facility-administered medications on file prior to visit.     BP 122/80   Pulse 82   Temp 98.1 F (36.7 C) (Oral)   Ht 5\' 6"  (1.676 m)   Wt 213 lb 1.9 oz (96.7 kg)   LMP 11/09/2016   SpO2 97%   BMI 34.40 kg/m    Objective:   Physical Exam  Constitutional: She is oriented to person, place, and time. She appears well-nourished.  HENT:  Right Ear:  Tympanic membrane and ear canal normal.  Left Ear: Tympanic membrane and ear canal normal.  Nose: Nose normal.  Mouth/Throat: Oropharynx is clear and moist.  Eyes: Pupils are equal, round, and reactive to light. Conjunctivae and EOM are normal.  Neck: Neck supple. No thyromegaly present.  Cardiovascular: Normal rate and regular rhythm.   No murmur heard. Pulmonary/Chest: Effort normal and breath sounds normal. She has no rales.  Abdominal: Soft. Bowel sounds are normal. There is no tenderness.  Musculoskeletal: Normal range of motion.  Lymphadenopathy:    She has no cervical adenopathy.  Neurological: She is alert and oriented to person, place, and time. She has normal reflexes. No cranial nerve deficit.  Skin: Skin is warm and dry. No rash noted.  2.5 cm x 1 cm hardened mass to right medial upper thigh. No erythema, non tender.  1.5 cm rounded, red, soft, mass to left lateral foot proximal to 5th digit. 1 cm rounded, red, soft, mass to right lateral foot proximal to 5th digit.  Psychiatric: She has a normal mood and affect.          Assessment & Plan:  Ladona [employer] Bunions:  Masses to lateral feet, representative of [employer] Bunions. Non tender. Do not appear infected. Referral placed to Podiatry for evaluation of bunions. Recommended OTC padding to protect when wearing shoes.  Morrie Sheldonlark,Katherine Kendal, NP

## 2016-11-09 NOTE — Assessment & Plan Note (Signed)
Immunizations UTD. Pap and mammogram UTD. Discussed the importance of a healthy diet and regular exercise in order for weight loss, and to reduce the risk of other medical problems. Exam with abnormal findings as mentioned in HPI. Labs unremarkable. Follow up in 1 year.

## 2016-11-09 NOTE — Assessment & Plan Note (Signed)
Doing well on Zoloft, continue same. 

## 2016-11-09 NOTE — Assessment & Plan Note (Signed)
Chronic for years, intermittent. Patient reports liver mass that was found 10 years ago. Exam today unremarkable, no tenderness or guarding. Ultrasound for RUQ abdomen pending.

## 2016-11-09 NOTE — Patient Instructions (Signed)
You will be contacted regarding your referral to Podiatry and your ultrasound of the abdomen.  Please let us know if you have not heard back within one week.   Start Bactrim DS (sulfamethoxazole/trimethoprim) tablets for abscess. Take 1 tablet by mouth twice daily for 10 days.  Apply warm compresses to the abscess site. Do this three times daily for 20 minutes.   Start exercising. You should be getting 150 minutes of moderate intensity exercise weekly.  It's important to improve your diet by reducing consumption of fast food, fried food, processed snack foods, sugary drinks. Increase consumption of fresh vegetables and fruits, whole grains, water.  Ensure you are drinking 64 ounces of water daily.  Follow up in 1 year for your annual exam or sooner if needed.

## 2016-11-09 NOTE — Assessment & Plan Note (Signed)
Treated in April 2018, improvement but no resolve. Exam today suspicious for recurring infection. Rx for Bactrim DS tablets sent to pharmacy.  Discussed warm compresses.

## 2016-11-12 ENCOUNTER — Encounter: Payer: Federal, State, Local not specified - PPO | Admitting: Primary Care

## 2016-11-17 ENCOUNTER — Ambulatory Visit
Admission: RE | Admit: 2016-11-17 | Discharge: 2016-11-17 | Disposition: A | Discharge status: Home / Self Care | Payer: Federal, State, Local not specified - PPO | Source: Ambulatory Visit | Attending: Primary Care | Admitting: Primary Care

## 2016-11-17 DIAGNOSIS — K769 Liver disease, unspecified: Secondary | ICD-10-CM | POA: Insufficient documentation

## 2016-11-17 DIAGNOSIS — R1011 Right upper quadrant pain: Secondary | ICD-10-CM | POA: Diagnosis not present

## 2016-11-29 ENCOUNTER — Ambulatory Visit (INDEPENDENT_AMBULATORY_CARE_PROVIDER_SITE_OTHER): Payer: Federal, State, Local not specified - PPO

## 2016-11-29 ENCOUNTER — Ambulatory Visit (INDEPENDENT_AMBULATORY_CARE_PROVIDER_SITE_OTHER): Payer: Federal, State, Local not specified - PPO | Admitting: Podiatry

## 2016-11-29 ENCOUNTER — Encounter: Payer: Self-pay | Admitting: Podiatry

## 2016-11-29 DIAGNOSIS — M21629 Bunionette of unspecified foot: Secondary | ICD-10-CM

## 2016-11-29 DIAGNOSIS — M71372 Other bursal cyst, left ankle and foot: Secondary | ICD-10-CM | POA: Diagnosis not present

## 2016-11-29 NOTE — Progress Notes (Signed)
   Subjective:    Patient ID: Heather Hutchinson, female    DOB: [birth year], 41 y.o.   MRN: [id number]  HPI    Review of Systems  Gastrointestinal: Positive for blood in stool.  All other systems reviewed and are negative.      Objective:   Physical Exam        Assessment & Plan:

## 2016-11-29 NOTE — Progress Notes (Signed)
Patient ID: Heather Hutchinson, female   DOB: [birth year], 41 y.o.   MRN: [id number]   HPI: 41 year old female presents to the office today for evaluation of large bumps on the outside of her bilateral feet. Patient states they do not hurt and she denies any significant trauma or pain. However she has noticed that over the year she developed large prominent lumps on the outside of her pinky toes. Patient presents today for further treatment and evaluation   Physical Exam: General: The patient is alert and oriented x3 in no acute distress.  Dermatology: Skin is warm, dry and supple bilateral lower extremities. Negative for open lesions or macerations.  Vascular: Palpable pedal pulses bilaterally. No edema or erythema noted. Capillary refill within normal limits.  Neurological: Epicritic and protective threshold grossly intact bilaterally.   Musculoskeletal Exam:Tailor's bunion deformity with prominent lateral deviation of the heads of the fifth metatarsals noted bilaterally with weightbearing. There is also large fluctuant soft tissue mass is noted surrounding the head of the fifth metatarsal bilaterally more significant on the left than the right. This is consistent with an adventitious bursa. Range of motion within normal limits to all pedal and ankle joints bilateral. Muscle strength 5/5 in all groups bilateral.   Radiographic Exam:  Normal osseous mineralization. Joint spaces preserved. No fracture/dislocation/boney destruction.  Increased intermetatarsal angle noted to the fourth intermetatarsal space the bilateral feet consistent with a tailor's bunion type deformity. Soft tissue mass also noted encompassing the head of the fifth metatarsal bilaterally consistent with clinical findings  Assessment: 1. Adventitious bursa bilateral fifth MPJ 2. Tailor's bunion deformity bilateral   Plan of Care:  1. Patient was evaluated. 2. Today we discussed conservative versus surgical management of the  presenting pathology. At the moment the pathology is completely nonsymptomatic. We're going to continue with conservative care which includes wide shoe gear that does not constrict the forefoot. 3. If symptoms persist and become aggravating we will begin with conservative treatment including anti-inflammatory injection. If all treatment fails we will possibly discuss surgical intervention in the future. 4. Return to clinic when necessary   Felecia Shelling, DPM [facility]  Dr. Felecia Shelling, DPM    [address] [place], [place] [address]                Office [phone]  Fax [phone]

## 2017-01-29 ENCOUNTER — Other Ambulatory Visit: Payer: Self-pay | Admitting: Primary Care

## 2017-01-29 DIAGNOSIS — F32A Depression, unspecified: Secondary | ICD-10-CM

## 2017-01-29 DIAGNOSIS — F329 Major depressive disorder, single episode, unspecified: Secondary | ICD-10-CM

## 2017-02-08 DIAGNOSIS — Z23 Encounter for immunization: Secondary | ICD-10-CM | POA: Diagnosis not present

## 2017-07-13 ENCOUNTER — Ambulatory Visit: Payer: Federal, State, Local not specified - PPO | Admitting: Primary Care

## 2017-07-13 ENCOUNTER — Encounter: Payer: Self-pay | Admitting: Primary Care

## 2017-07-13 VITALS — BP 106/64 | HR 82 | Temp 98.1°F | Wt 222.0 lb

## 2017-07-13 DIAGNOSIS — J3489 Other specified disorders of nose and nasal sinuses: Secondary | ICD-10-CM

## 2017-07-13 MED ORDER — MUPIROCIN 2 % EX OINT: 1.0000 "application " | TOPICAL_OINTMENT | Freq: Two times a day (BID) | CUTANEOUS | 0 refills | Status: DC

## 2017-07-13 NOTE — Patient Instructions (Signed)
Apply the bactroban nasal ointment twice daily for one week as needed for nasal sore flares.  It was a pleasure to see you today!

## 2017-07-13 NOTE — Progress Notes (Signed)
Subjective:    Patient ID: Heather Hutchinson, female    DOB: 05/07/1975, 42 y.o.   MRN: [id number]  HPI  Ms. Heather Hutchinson is a 42 year old female who presents today with a chief complaint of nasal sore.  The sore is located to the tip of her inner left nares that has been intermittent for years. She's used neosporin in the past with improvement, no improvement this time. She's started to notice erythema on the left outer rim of the nares. She denies cough, rhinorrhea, post nasal drip.   Review of Systems  Constitutional: Negative for fever.  HENT: Negative for congestion, rhinorrhea and sore throat.   Respiratory: Negative for cough and shortness of breath.        Past Medical History:  Diagnosis Date  . Genital warts   . Migraines   . Syncope      Social History   Socioeconomic History  . Marital status: Single    Spouse name: Not on file  . Number of children: Not on file  . Years of education: Not on file  . Highest education level: Not on file  Occupational History  . Not on file  Social Needs  . Financial resource strain: Not on file  . Food insecurity:    Worry: Not on file    Inability: Not on file  . Transportation needs:    Medical: Not on file    Non-medical: Not on file  Tobacco Use  . Smoking status: Current Every Day Smoker    Packs/day: 0.50    Types: Cigarettes  . Smokeless tobacco: Never Used  Substance and Sexual Activity  . Alcohol use: No    Alcohol/week: 0.0 oz  . Drug use: No  . Sexual activity: Not on file  Lifestyle  . Physical activity:    Days per week: Not on file    Minutes per session: Not on file  . Stress: Not on file  Relationships  . Social connections:    Talks on phone: Not on file    Gets together: Not on file    Attends religious service: Not on file    Active member of club or organization: Not on file    Attends meetings of clubs or organizations: Not on file    Relationship status: Not on file  . Intimate partner  violence:    Fear of current or ex partner: Not on file    Emotionally abused: Not on file    Physically abused: Not on file    Forced sexual activity: Not on file  Other Topics Concern  . Not on file  Social History Narrative   Single.   No children.   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   Enjoys spending time with her dogs.     No past surgical history on file.  Family History  Problem Relation Age of Onset  . Sudden death Brother     No Known Allergies  Current Outpatient Medications on File Prior to Visit  Medication Sig Dispense Refill  . CAMILA 0.35 MG tablet Take 1 tablet by mouth daily.  1  . sertraline (ZOLOFT) 50 MG tablet TAKE 1 TABLET (50 MG TOTAL) BY MOUTH DAILY. 90 tablet 3  . fluticasone (FLONASE) 50 MCG/ACT nasal spray Place 2 sprays into both nostrils daily. (Patient not taking: Reported on 07/13/2017) 16 g 0   No current facility-administered medications on file prior to visit.     BP 106/64   Pulse 82  Temp 98.1 F (36.7 C) (Oral)   Wt 222 lb (100.7 kg)   LMP 06/22/2017   BMI 35.83 kg/m    Objective:   Physical Exam  Constitutional: She appears well-nourished.  HENT:  Irritation and small sore to left inner nares at tip  Cardiovascular: Normal rate and regular rhythm.  Pulmonary/Chest: Effort normal and breath sounds normal.  Skin: Skin is dry.          Assessment & Plan:  Nasal Sore:  Located to left inner nares intermittently for years. Exam today with mild/moderate erythema/irritation. Rx for bactroban ointment sent to pharmacy, instructions provided. Follow up PRN.  Doreene Nest, NP

## 2017-08-12 ENCOUNTER — Telehealth: Payer: Federal, State, Local not specified - PPO | Admitting: Family

## 2017-08-12 DIAGNOSIS — B9689 Other specified bacterial agents as the cause of diseases classified elsewhere: Secondary | ICD-10-CM

## 2017-08-12 DIAGNOSIS — J028 Acute pharyngitis due to other specified organisms: Secondary | ICD-10-CM

## 2017-08-12 MED ORDER — AZITHROMYCIN 250 MG PO TABS: ORAL_TABLET | ORAL | 0 refills | Status: DC

## 2017-08-12 MED ORDER — PREDNISONE 5 MG PO TABS: 5.0000 mg | ORAL_TABLET | ORAL | 0 refills | Status: DC

## 2017-08-12 MED ORDER — BENZONATATE 100 MG PO CAPS: 100.0000 mg | ORAL_CAPSULE | Freq: Three times a day (TID) | ORAL | 0 refills | Status: DC | PRN

## 2017-08-12 NOTE — Progress Notes (Signed)
Thank you for the details you included in the comment boxes. Those details are very helpful in determining the best course of treatment for you and help [place] to provide the best care. This presents like a sinus infection which has migrated into your airways becoming pharyngitis.   We are sorry that you are not feeling well.  Here is how we plan to help!  Based on your presentation I believe you most likely have A cough due to bacteria.  When patients have a fever and a productive cough with a change in color or increased sputum production, we are concerned about bacterial bronchitis.  If left untreated it can progress to pneumonia.  If your symptoms do not improve with your treatment plan it is important that you contact your provider.   I have prescribed Azithromyin 250 mg: two tablets now and then one tablet daily for 4 additonal days    In addition you may use A non-prescription cough medication called Mucinex DM: take 2 tablets every 12 hours. and A prescription cough medication called Tessalon Perles . You may take 1-2 capsules every 8 hours as needed for your cough.  Prednisone 5 mg daily for 6 days (see taper instructions below)  Directions for 6 day taper: Day 1: 2 tablets before breakfast, 1 after both lunch & dinner and 2 at bedtime Day 2: 1 tab before breakfast, 1 after both lunch & dinner and 2 at bedtime Day 3: 1 tab at each meal & 1 at bedtime Day 4: 1 tab at breakfast, 1 at lunch, 1 at bedtime Day 5: 1 tab at breakfast & 1 tab at bedtime Day 6: 1 tab at breakfast   From your responses in the eVisit questionnaire you describe inflammation in the upper respiratory tract which is causing a significant cough.  This is commonly called Bronchitis and has four common causes:    Allergies  Viral Infections  Acid Reflux  Bacterial Infection Allergies, viruses and acid reflux are treated by controlling symptoms or eliminating the cause. An example might be a cough caused by taking  certain blood pressure medications. You stop the cough by changing the medication. Another example might be a cough caused by acid reflux. Controlling the reflux helps control the cough.  USE OF BRONCHODILATOR ("RESCUE") INHALERS: There is a risk from using your bronchodilator too frequently.  The risk is that over-reliance on a medication which only relaxes the muscles surrounding the breathing tubes can reduce the effectiveness of medications prescribed to reduce swelling and congestion of the tubes themselves.  Although you feel brief relief from the bronchodilator inhaler, your asthma may actually be worsening with the tubes becoming more swollen and filled with mucus.  This can delay other crucial treatments, such as oral steroid medications. If you need to use a bronchodilator inhaler daily, several times per day, you should discuss this with your provider.  There are probably better treatments that could be used to keep your asthma under control.     HOME CARE . Only take medications as instructed by your medical team. . Complete the entire course of an antibiotic. . Drink plenty of fluids and get plenty of rest. . Avoid close contacts especially the very young and the elderly . Cover your mouth if you cough or cough into your sleeve. . Always remember to wash your hands . A steam or ultrasonic humidifier can help congestion.   GET HELP RIGHT AWAY IF: . You develop worsening fever. . You become  short of breath . You cough up blood. . Your symptoms persist after you have completed your treatment plan MAKE SURE YOU   Understand these instructions.  Will watch your condition.  Will get help right away if you are not doing well or get worse.  Your e-visit answers were reviewed by a board certified advanced clinical practitioner to complete your personal care plan.  Depending on the condition, your plan could have included both over the counter or prescription medications. If there is a  problem please reply  once you have received a response from your provider. Your safety is important to [place].  If you have drug allergies check your prescription carefully.    You can use MyChart to ask questions about today's visit, request a non-urgent call back, or ask for a work or school excuse for 24 hours related to this e-Visit. If it has been greater than 24 hours you will need to follow up with your provider, or enter a new e-Visit to address those concerns. You will get an e-mail in the next two days asking about your experience.  I hope that your e-visit has been valuable and will speed your recovery. Thank you for using e-visits.

## 2017-09-30 DIAGNOSIS — Z01419 Encounter for gynecological examination (general) (routine) without abnormal findings: Secondary | ICD-10-CM | POA: Diagnosis not present

## 2017-09-30 DIAGNOSIS — Z6836 Body mass index (BMI) 36.0-36.9, adult: Secondary | ICD-10-CM | POA: Diagnosis not present

## 2017-09-30 DIAGNOSIS — Z1151 Encounter for screening for human papillomavirus (HPV): Secondary | ICD-10-CM | POA: Diagnosis not present

## 2018-03-25 ENCOUNTER — Other Ambulatory Visit: Payer: Self-pay | Admitting: Primary Care

## 2018-03-25 DIAGNOSIS — F32A Depression, unspecified: Secondary | ICD-10-CM

## 2018-03-25 DIAGNOSIS — F329 Major depressive disorder, single episode, unspecified: Secondary | ICD-10-CM

## 2018-03-27 NOTE — Telephone Encounter (Signed)
Last prescribed on 01/31/2017 #90 with 3 refills Last office visit on 07/13/2017

## 2018-03-27 NOTE — Telephone Encounter (Signed)
Patient is overdue for general follow-up and will need to be seen for further refills of her Zoloft medication.  I will send a 30-day supply for now but she will need to schedule an appointment for further refills.

## 2018-03-27 NOTE — Telephone Encounter (Signed)
Irving Burtonmily, can you schedule this? Thank you

## 2018-03-30 ENCOUNTER — Ambulatory Visit (INDEPENDENT_AMBULATORY_CARE_PROVIDER_SITE_OTHER): Payer: Federal, State, Local not specified - PPO

## 2018-03-30 DIAGNOSIS — Z23 Encounter for immunization: Secondary | ICD-10-CM

## 2018-04-20 ENCOUNTER — Other Ambulatory Visit: Payer: Self-pay | Admitting: Primary Care

## 2018-04-20 DIAGNOSIS — F329 Major depressive disorder, single episode, unspecified: Secondary | ICD-10-CM

## 2018-04-20 DIAGNOSIS — F32A Depression, unspecified: Secondary | ICD-10-CM

## 2018-04-24 ENCOUNTER — Other Ambulatory Visit: Payer: Self-pay | Admitting: Primary Care

## 2018-04-24 DIAGNOSIS — Z0000 Encounter for general adult medical examination without abnormal findings: Secondary | ICD-10-CM

## 2018-04-24 DIAGNOSIS — E559 Vitamin D deficiency, unspecified: Secondary | ICD-10-CM

## 2018-05-02 ENCOUNTER — Other Ambulatory Visit (INDEPENDENT_AMBULATORY_CARE_PROVIDER_SITE_OTHER): Payer: Federal, State, Local not specified - PPO

## 2018-05-02 DIAGNOSIS — Z0000 Encounter for general adult medical examination without abnormal findings: Secondary | ICD-10-CM

## 2018-05-02 DIAGNOSIS — E559 Vitamin D deficiency, unspecified: Secondary | ICD-10-CM | POA: Diagnosis not present

## 2018-05-02 LAB — COMPREHENSIVE METABOLIC PANEL
ALBUMIN: 4 g/dL (ref 3.5–5.2)
ALK PHOS: 62 U/L (ref 39–117)
ALT: 11 U/L (ref 0–35)
AST: 14 U/L (ref 0–37)
BILIRUBIN TOTAL: 0.4 mg/dL (ref 0.2–1.2)
BUN: 10 mg/dL (ref 6–23)
CALCIUM: 9.1 mg/dL (ref 8.4–10.5)
CHLORIDE: 103 meq/L (ref 96–112)
CO2: 28 mEq/L (ref 19–32)
CREATININE: 0.78 mg/dL (ref 0.40–1.20)
GFR: 80.61 mL/min (ref >60.00)
Glucose, Bld: 96 mg/dL (ref 70–99)
Potassium: 4.2 mEq/L (ref 3.5–5.1)
Sodium: 138 mEq/L (ref 135–145)
TOTAL PROTEIN: 6.9 g/dL (ref 6.0–8.3)

## 2018-05-02 LAB — LDL CHOLESTEROL, DIRECT: Direct LDL: 100 mg/dL

## 2018-05-02 LAB — LIPID PANEL
CHOL/HDL RATIO: 6
Cholesterol: 155 mg/dL (ref 0–200)
HDL: 26.5 mg/dL — ABNORMAL LOW (ref >39.00)
NONHDL: 128.94
Triglycerides: 239 mg/dL — ABNORMAL HIGH (ref 0.0–149.0)
VLDL: 47.8 mg/dL — ABNORMAL HIGH (ref 0.0–40.0)

## 2018-05-02 LAB — VITAMIN D 25 HYDROXY (VIT D DEFICIENCY, FRACTURES): VITD: 25.28 ng/mL — AB (ref 30.00–100.00)

## 2018-05-04 LAB — VITAMIN B12: VITAMIN B 12: 213 pg/mL (ref 211–911)

## 2018-05-05 ENCOUNTER — Encounter: Payer: Federal, State, Local not specified - PPO | Admitting: Primary Care

## 2018-05-09 ENCOUNTER — Encounter: Payer: Self-pay | Admitting: Primary Care

## 2018-05-09 ENCOUNTER — Ambulatory Visit (INDEPENDENT_AMBULATORY_CARE_PROVIDER_SITE_OTHER): Payer: Federal, State, Local not specified - PPO | Admitting: Primary Care

## 2018-05-09 VITALS — BP 108/70 | HR 82 | Temp 98.2°F | Ht 66.0 in | Wt 217.8 lb

## 2018-05-09 DIAGNOSIS — R160 Hepatomegaly, not elsewhere classified: Secondary | ICD-10-CM | POA: Insufficient documentation

## 2018-05-09 DIAGNOSIS — L0291 Cutaneous abscess, unspecified: Secondary | ICD-10-CM | POA: Diagnosis not present

## 2018-05-09 DIAGNOSIS — Z0000 Encounter for general adult medical examination without abnormal findings: Secondary | ICD-10-CM | POA: Diagnosis not present

## 2018-05-09 DIAGNOSIS — R51 Headache: Secondary | ICD-10-CM | POA: Diagnosis not present

## 2018-05-09 DIAGNOSIS — R1011 Right upper quadrant pain: Secondary | ICD-10-CM

## 2018-05-09 DIAGNOSIS — F3342 Major depressive disorder, recurrent, in full remission: Secondary | ICD-10-CM

## 2018-05-09 DIAGNOSIS — R519 Headache, unspecified: Secondary | ICD-10-CM

## 2018-05-09 NOTE — Assessment & Plan Note (Signed)
Immunizations UTD. Pap smear UTD, she follows with GYN. Mammogram overdue, recommended she have this done. She goes to GYN office for this. Recommended regular exercise, healthy diet. Exam unremarkable.  Labs reviewed. Follow up in 1 year for CPE.

## 2018-05-09 NOTE — Assessment & Plan Note (Signed)
Located to left hepatic lobe, favored to be hemangioma.  Never repeated ultrasound as requested. Repeat ultrasound pending.

## 2018-05-09 NOTE — Patient Instructions (Addendum)
Please have your mammogram completed this year.  Please message me with date of your last pap smear.  Start exercising. You should be getting 150 minutes of moderate intensity exercise weekly.  It's important to improve your diet by reducing consumption of fast food, fried food, processed snack foods, sugary drinks. Increase consumption of fresh vegetables and fruits, whole grains, water.  Ensure you are drinking 64 ounces of water daily.  You will be contacted regarding your abdominal ultrasound.  Please let us know if you have not been contacted within one week.   Start Fish Oil 1200 mg once daily with a meal.   Start vitamin B12 1000 mcg once daily for energy.   We will see you next year for your annual exam or sooner if needed.  It was a pleasure to see you today!   Preventive Care 40-64 Years, Female Preventive care refers to lifestyle choices and visits with your health care provider that can promote health and wellness. What does preventive care include?   A yearly physical exam. This is also called an annual well check.  Dental exams once or twice a year.  Routine eye exams. Ask your health care provider how often you should have your eyes checked.  Personal lifestyle choices, including: ? Daily care of your teeth and gums. ? Regular physical activity. ? Eating a healthy diet. ? Avoiding tobacco and drug use. ? Limiting alcohol use. ? Practicing safe sex. ? Taking low-dose aspirin daily starting at age 76. ? Taking vitamin and mineral supplements as recommended by your health care provider. What happens during an annual well check? The services and screenings done by your health care provider during your annual well check will depend on your age, overall health, lifestyle risk factors, and family history of disease. Counseling Your health care provider may ask you questions about your:  Alcohol use.  Tobacco use.  Drug use.  Emotional well-being.  Home  and relationship well-being.  Sexual activity.  Eating habits.  Work and work Statistician.  Method of birth control.  Menstrual cycle.  Pregnancy history. Screening You may have the following tests or measurements:  Height, weight, and BMI.  Blood pressure.  Lipid and cholesterol levels. These may be checked every 5 years, or more frequently if you are over 30 years old.  Skin check.  Lung cancer screening. You may have this screening every year starting at age 20 if you have a 30-pack-year history of smoking and currently smoke or have quit within the past 15 years.  Colorectal cancer screening. All adults should have this screening starting at age 16 and continuing until age 79. Your health care provider may recommend screening at age 1. You will have tests every 1-10 years, depending on your results and the type of screening test. People at increased risk should start screening at an earlier age. Screening tests may include: ? Guaiac-based fecal occult blood testing. ? Fecal immunochemical test (FIT). ? Stool DNA test. ? Virtual colonoscopy. ? Sigmoidoscopy. During this test, a flexible tube with a tiny camera (sigmoidoscope) is used to examine your rectum and lower colon. The sigmoidoscope is inserted through your anus into your rectum and lower colon. ? Colonoscopy. During this test, a long, thin, flexible tube with a tiny camera (colonoscope) is used to examine your entire colon and rectum.  Hepatitis C blood test.  Hepatitis B blood test.  Sexually transmitted disease (STD) testing.  Diabetes screening. This is done by checking your blood sugar (  glucose) after you have not eaten for a while (fasting). You may have this done every 1-3 years.  Mammogram. This may be done every 1-2 years. Talk to your health care provider about when you should start having regular mammograms. This may depend on whether you have a family history of breast cancer.  BRCA-related cancer  screening. This may be done if you have a family history of breast, ovarian, tubal, or peritoneal cancers.  Pelvic exam and Pap test. This may be done every 3 years starting at age 30. Starting at age 71, this may be done every 5 years if you have a Pap test in combination with an HPV test.  Bone density scan. This is done to screen for osteoporosis. You may have this scan if you are at high risk for osteoporosis. Discuss your test results, treatment options, and if necessary, the need for more tests with your health care provider. Vaccines Your health care provider may recommend certain vaccines, such as:  Influenza vaccine. This is recommended every year.  Tetanus, diphtheria, and acellular pertussis (Tdap, Td) vaccine. You may need a Td booster every 10 years.  Varicella vaccine. You may need this if you have not been vaccinated.  Zoster vaccine. You may need this after age 20.  Measles, mumps, and rubella (MMR) vaccine. You may need at least one dose of MMR if you were born in 1957 or later. You may also need a second dose.  Pneumococcal 13-valent conjugate (PCV13) vaccine. You may need this if you have certain conditions and were not previously vaccinated.  Pneumococcal polysaccharide (PPSV23) vaccine. You may need one or two doses if you smoke cigarettes or if you have certain conditions.  Meningococcal vaccine. You may need this if you have certain conditions.  Hepatitis A vaccine. You may need this if you have certain conditions or if you travel or work in places where you may be exposed to hepatitis A.  Hepatitis B vaccine. You may need this if you have certain conditions or if you travel or work in places where you may be exposed to hepatitis B.  Haemophilus influenzae type b (Hib) vaccine. You may need this if you have certain conditions. Talk to your health care provider about which screenings and vaccines you need and how often you need them. This information is not  intended to replace advice given to you by your health care provider. Make sure you discuss any questions you have with your health care provider. Document Released: 04/25/2015 Document Revised: 05/19/2017 Document Reviewed: 01/28/2015 Elsevier Interactive Patient Education  2019 [employer].

## 2018-05-09 NOTE — Progress Notes (Signed)
Subjective:    Patient ID: Heather Hutchinson, female    DOB: Apr 29, 1975, 43 y.o.   MRN: [id number]  HPI  Heather Hutchinson is a 43 year old female who presents today for complete physical.  She continues to experience right upper quadrant abdominal pain. It will occur once every several weeks and will last for a few days. She denies nausea, vomiting, diarrhea. Pain typically comes with certain positions including bending/leaning forward, sitting erect. Symptoms will improve with standing and massage. No increased symptoms with food or beverages.   Immunizations: -Tetanus: Completed in 2017 -Influenza: Completed this season   Diet: She endorse a fair diet, recently changed Breakfast: Boiled eggs, grits, eggs Lunch: Salad, chicken, restaurants Dinner: Meat, vegetables, starch Snacks: Nuts, cheese, yogurt, candy, chips Desserts: Daily  Beverages: Diet soda, regular soda, water, sweet tea  Exercise: She is not exercising  Eye exam: Completed in 2018 Dental exam: Completes semi-annually Pap Smear: Completed in 2018? Mammogram: Completed 2 years ago. Will get at GYN office.   Review of Systems  Constitutional: Negative for unexpected weight change.  HENT: Negative for rhinorrhea.   Respiratory: Negative for cough and shortness of breath.   Cardiovascular: Negative for chest pain.  Gastrointestinal: Positive for abdominal pain. Negative for constipation, diarrhea, nausea and vomiting.  Genitourinary: Negative for difficulty urinating.  Musculoskeletal: Negative for arthralgias and myalgias.  Skin: Negative for rash.  Allergic/Immunologic: Negative for environmental allergies.  Neurological: Negative for dizziness, numbness and headaches.  Psychiatric/Behavioral: Negative for suicidal ideas.       Feels well managed on Zoloft.        Past Medical History:  Diagnosis Date  . Genital warts   . Migraines   . Syncope      Social History   Socioeconomic History  . Marital  status: Single    Spouse name: Not on file  . Number of children: Not on file  . Years of education: Not on file  . Highest education level: Not on file  Occupational History  . Not on file  Social Needs  . Financial resource strain: Not on file  . Food insecurity:    Worry: Not on file    Inability: Not on file  . Transportation needs:    Medical: Not on file    Non-medical: Not on file  Tobacco Use  . Smoking status: Current Every Day Smoker    Packs/day: 0.50    Types: Cigarettes  . Smokeless tobacco: Never Used  Substance and Sexual Activity  . Alcohol use: No    Alcohol/week: 0.0 standard drinks  . Drug use: No  . Sexual activity: Not on file  Lifestyle  . Physical activity:    Days per week: Not on file    Minutes per session: Not on file  . Stress: Not on file  Relationships  . Social connections:    Talks on phone: Not on file    Gets together: Not on file    Attends religious service: Not on file    Active member of club or organization: Not on file    Attends meetings of clubs or organizations: Not on file    Relationship status: Not on file  . Intimate partner violence:    Fear of current or ex partner: Not on file    Emotionally abused: Not on file    Physically abused: Not on file    Forced sexual activity: Not on file  Other Topics Concern  . Not on file  Social History Narrative   Single.   No children.   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   Enjoys spending time with her dogs.     No past surgical history on file.  Family History  Problem Relation Age of Onset  . Sudden death Brother     No Known Allergies  Current Outpatient Medications on File Prior to Visit  Medication Sig Dispense Refill  . CAMILA 0.35 MG tablet Take 1 tablet by mouth daily.  1  . mupirocin ointment (BACTROBAN) 2 % Place 1 application into the nose 2 (two) times daily. 22 g 0  . predniSONE (DELTASONE) 5 MG tablet Take 1 tablet (5 mg total) by mouth as directed. Taper  6,5,4,3,2,1 21 tablet 0  . sertraline (ZOLOFT) 50 MG tablet Take 1 tablet (50 mg total) by mouth daily. Dx Code F32.9 90 tablet 0   No current facility-administered medications on file prior to visit.     BP 108/70   Pulse 82   Temp 98.2 F (36.8 C) (Oral)   Ht 5\' 6"  (1.676 m)   Wt 217 lb 12 oz (98.8 kg)   LMP 04/23/2018   SpO2 97%   BMI 35.15 kg/m    Objective:   Physical Exam  Constitutional: She is oriented to person, place, and time. She appears well-nourished.  HENT:  Mouth/Throat: No oropharyngeal exudate.  Eyes: Pupils are equal, round, and reactive to light. EOM are normal.  Neck: Neck supple. No thyromegaly present.  Cardiovascular: Normal rate and regular rhythm.  Respiratory: Effort normal and breath sounds normal.  GI: Soft. Bowel sounds are normal. There is no abdominal tenderness.  Musculoskeletal: Normal range of motion.  Neurological: She is alert and oriented to person, place, and time.  Skin: Skin is warm and dry.  Psychiatric: She has a normal mood and affect.           Assessment & Plan:

## 2018-05-09 NOTE — Assessment & Plan Note (Signed)
Overall improved and infrequent. Continue to monitor.

## 2018-05-09 NOTE — Assessment & Plan Note (Addendum)
Continues and is intermittent. Given that symptoms are typically noticeable with sitting erect, leaning forward and are relived with laying flat/standing, we may be looking at a hernia as cause. Abdominal exam unremarkable.  Repeat ultrasound pending.

## 2018-05-09 NOTE — Assessment & Plan Note (Signed)
Doing well on Zoloft 50 mg overall.  Continue same. Denies SI/HI.

## 2018-05-09 NOTE — Assessment & Plan Note (Signed)
Resolved

## 2018-06-08 ENCOUNTER — Ambulatory Visit
Admission: RE | Admit: 2018-06-08 | Discharge: 2018-06-08 | Disposition: A | Discharge status: Home / Self Care | Payer: Federal, State, Local not specified - PPO | Source: Ambulatory Visit | Attending: Primary Care | Admitting: Primary Care

## 2018-06-08 DIAGNOSIS — K824 Cholesterolosis of gallbladder: Secondary | ICD-10-CM | POA: Diagnosis not present

## 2018-06-08 DIAGNOSIS — R1011 Right upper quadrant pain: Secondary | ICD-10-CM

## 2018-07-23 ENCOUNTER — Other Ambulatory Visit: Payer: Self-pay | Admitting: Primary Care

## 2018-07-23 DIAGNOSIS — F329 Major depressive disorder, single episode, unspecified: Secondary | ICD-10-CM

## 2018-07-23 DIAGNOSIS — F32A Depression, unspecified: Secondary | ICD-10-CM

## 2018-11-19 IMAGING — US US ABDOMEN LIMITED
1 series · 14 of 25 positions shown · non-contrast
Comparison: 12/21/2006 abdominal CT.

CLINICAL DATA: Right upper quadrant abdominal pain.

EXAM:
ULTRASOUND ABDOMEN LIMITED RIGHT UPPER QUADRANT

[Series 1: us abdomen limited · 0.23mm/px · 14 of 44 slices shown]
[im 1/44]
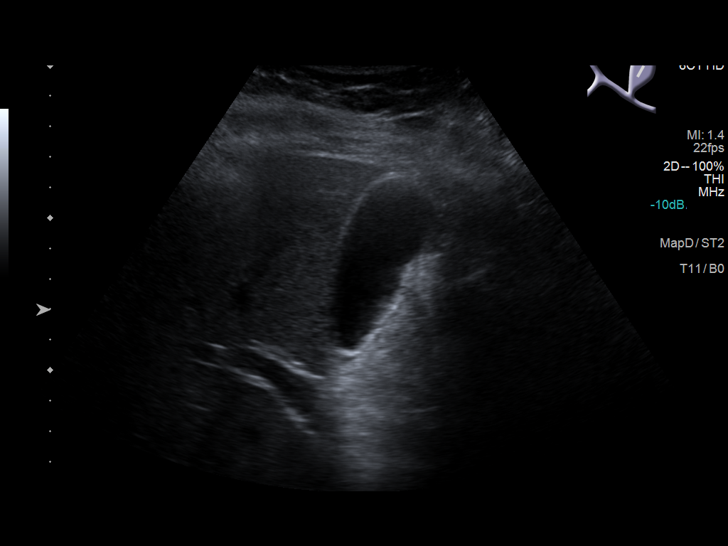
[im 4/44]
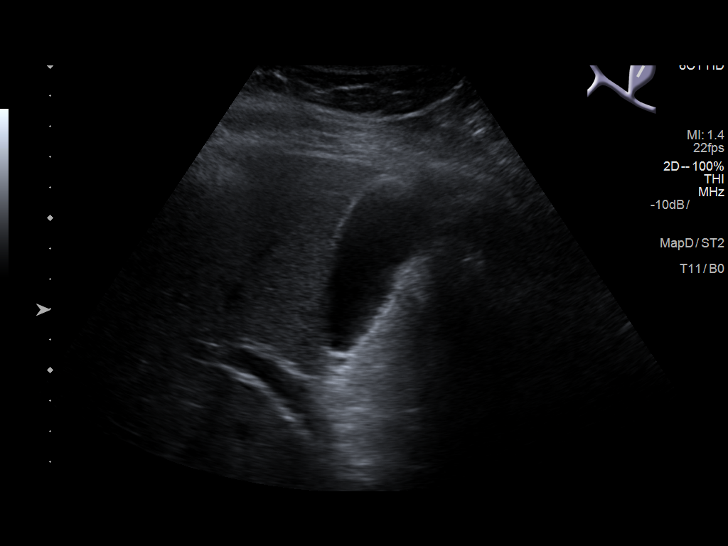
[im 8/44]
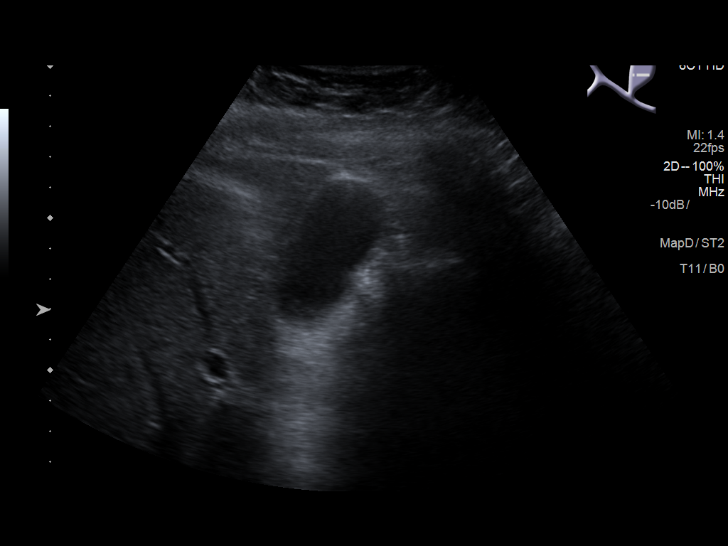
[im 11/44]
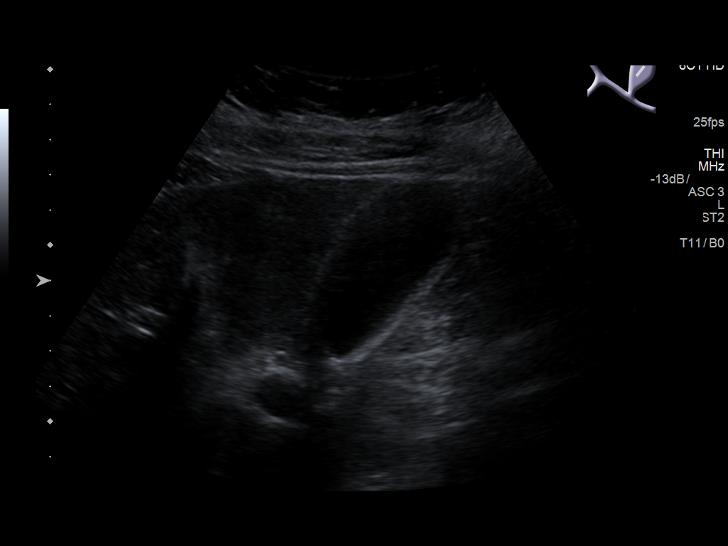
[im 15/44]
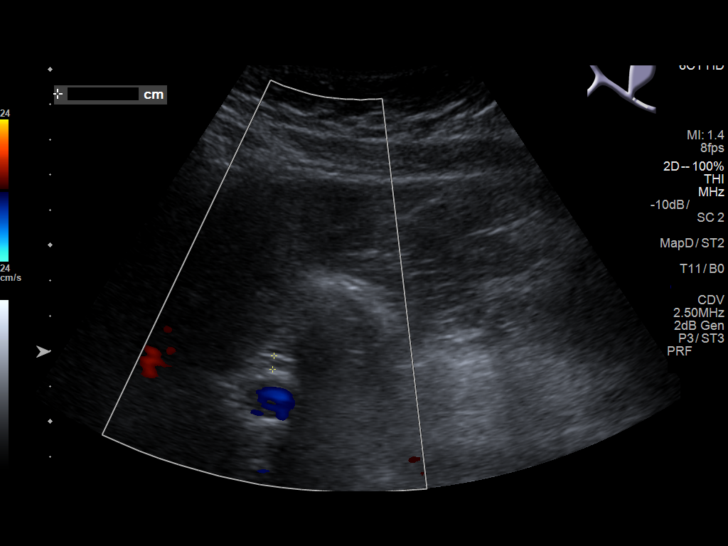
[im 17/44]
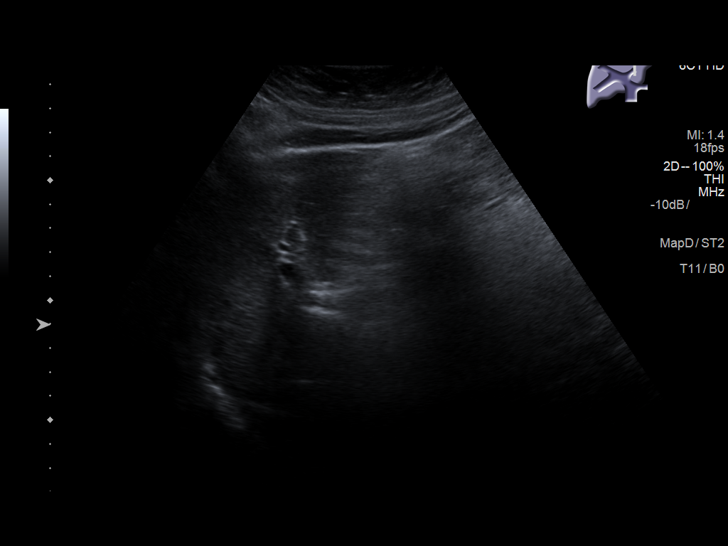
[im 20/44]
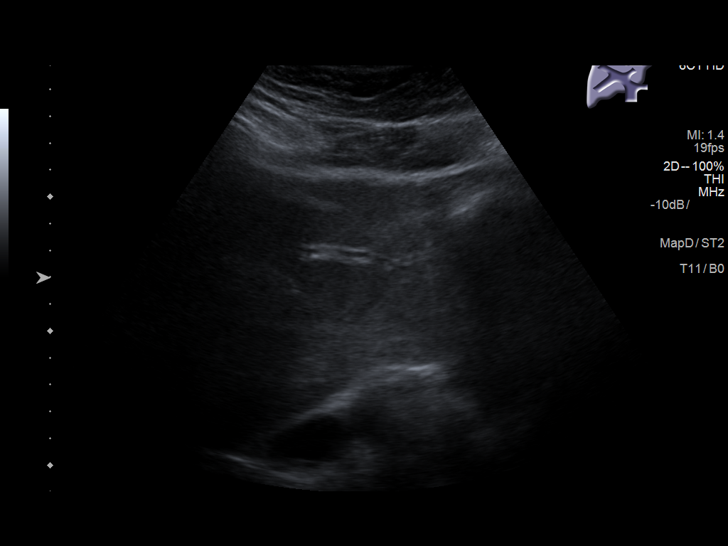
[im 24/44]
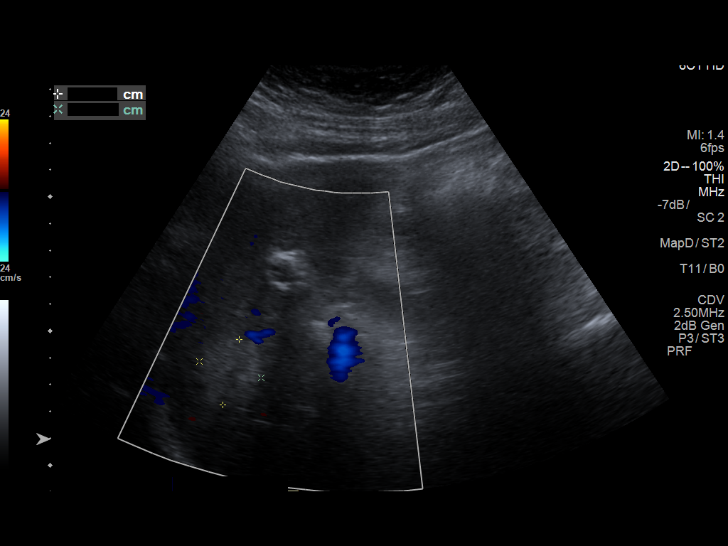
[im 27/44]
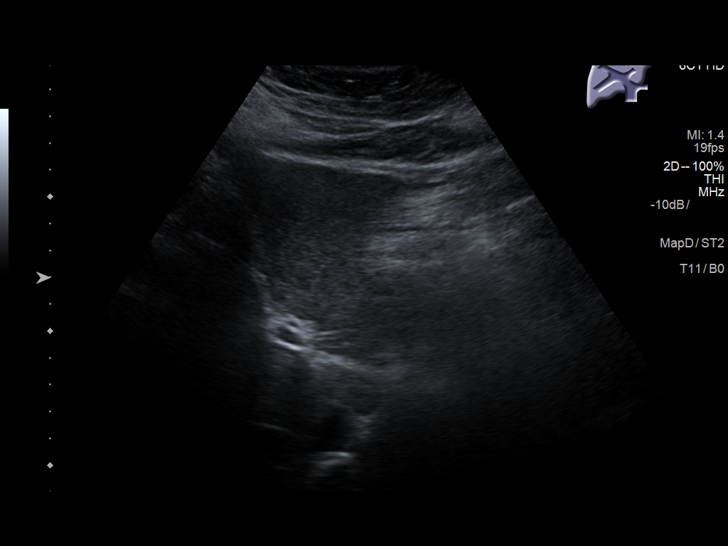
[im 29/44]
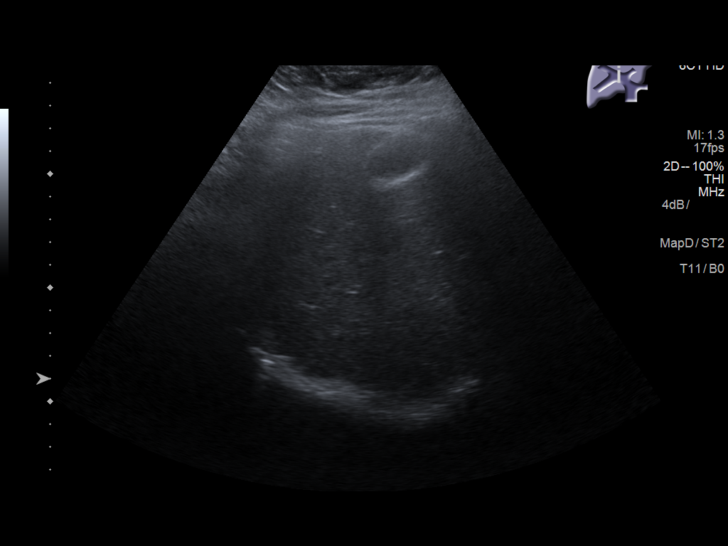
[im 33/44]
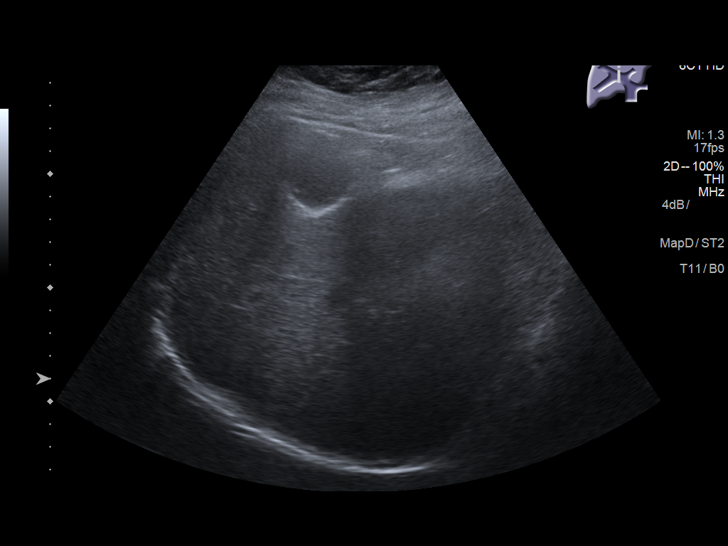
[im 36/44]
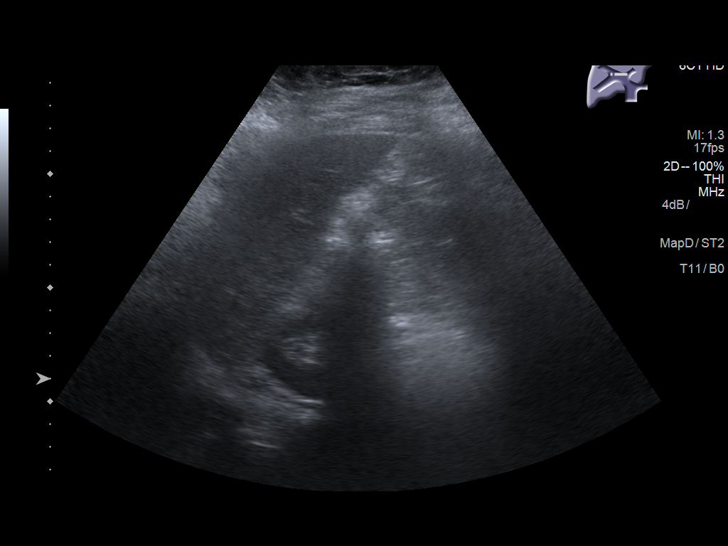
[im 40/44]
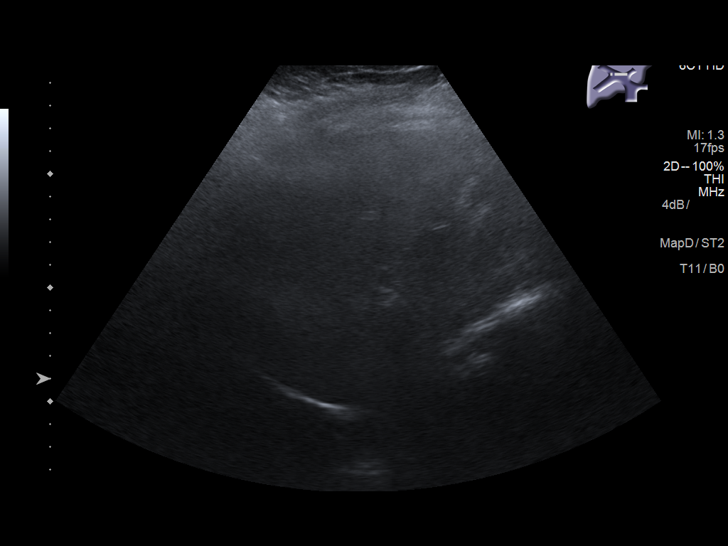
[im 44/44]
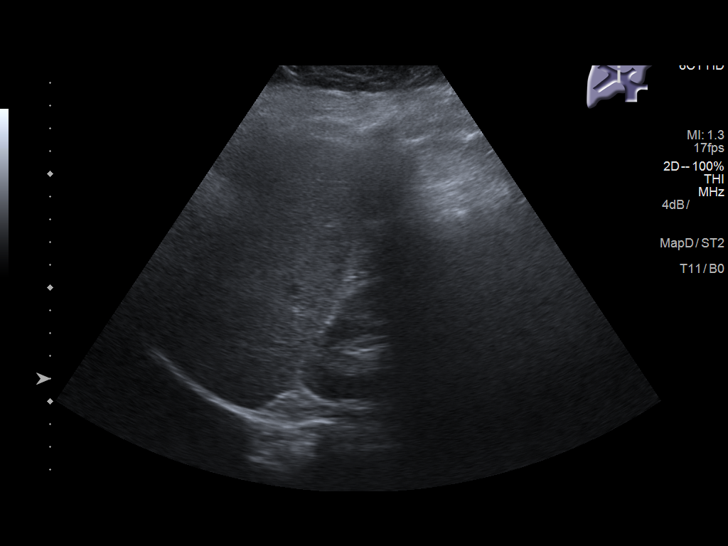

[14 of 25 positions shown; findings below may reference images not displayed]

FINDINGS: Gallbladder:

No gallstones or wall thickening visualized. No sonographic Murphy
sign noted by sonographer.

Common bile duct:

Diameter: Normal, 4 mm.

Liver:

Left hepatic lobe hyperechoic, minimally heterogeneous lesion
measures 2.5 x 2.4 x 1.9 cm.
IMPRESSION: 1.  No acute process or explanation for right upper quadrant pain.
2. 2.5 cm left hepatic lobe hyperechoic lesion. Favored to represent
a hemangioma. Especially given a smaller lesion in this area on the
7334 CT, ultrasound surveillance at 3 months to confirm size
stability is a potential clinical strategy. Especially if there is
any history of primary malignancy or liver disease, a more
aggressive approach with pre and post contrast abdominal MRI should
be considered.

## 2018-12-01 DIAGNOSIS — Z3041 Encounter for surveillance of contraceptive pills: Secondary | ICD-10-CM | POA: Diagnosis not present

## 2018-12-01 DIAGNOSIS — Z01419 Encounter for gynecological examination (general) (routine) without abnormal findings: Secondary | ICD-10-CM | POA: Diagnosis not present

## 2018-12-01 DIAGNOSIS — Z1231 Encounter for screening mammogram for malignant neoplasm of breast: Secondary | ICD-10-CM | POA: Diagnosis not present

## 2018-12-01 DIAGNOSIS — Z6835 Body mass index (BMI) 35.0-35.9, adult: Secondary | ICD-10-CM | POA: Diagnosis not present

## 2018-12-01 LAB — HM MAMMOGRAPHY

## 2018-12-04 ENCOUNTER — Encounter: Payer: Self-pay | Admitting: Primary Care

## 2019-03-11 ENCOUNTER — Other Ambulatory Visit: Payer: Self-pay | Admitting: Primary Care

## 2019-03-11 DIAGNOSIS — F329 Major depressive disorder, single episode, unspecified: Secondary | ICD-10-CM

## 2019-03-11 DIAGNOSIS — F32A Depression, unspecified: Secondary | ICD-10-CM

## 2019-03-16 ENCOUNTER — Other Ambulatory Visit: Payer: Self-pay

## 2019-03-16 DIAGNOSIS — Z20822 Contact with and (suspected) exposure to covid-19: Secondary | ICD-10-CM

## 2019-03-17 LAB — NOVEL CORONAVIRUS, NAA: SARS-CoV-2, NAA: NOT DETECTED

## 2019-06-29 ENCOUNTER — Other Ambulatory Visit: Payer: Self-pay

## 2019-06-29 ENCOUNTER — Ambulatory Visit: Payer: Federal, State, Local not specified - PPO | Attending: Internal Medicine

## 2019-06-29 DIAGNOSIS — Z23 Encounter for immunization: Secondary | ICD-10-CM

## 2019-06-29 NOTE — Progress Notes (Signed)
   Covid-19 Vaccination Clinic  Name:  Heather Hutchinson    MRN: [id number] DOB: [birth year]  06/29/2019  [name] was observed post Covid-19 immunization for 15 minutes without incident. She was provided with Vaccine Information Sheet and instruction to access the V-Safe system.   [name] was instructed to call 911 with any severe reactions post vaccine: Marland Kitchen Difficulty breathing  . Swelling of face and throat  . A fast heartbeat  . A bad rash all over body  . Dizziness and weakness   Immunizations Administered    Name Date Dose VIS Date Route   Pfizer COVID-19 Vaccine 06/29/2019 11:46 AM 0.3 mL 03/23/2019 Intramuscular   Manufacturer: [employer], [employer]   Lot: [id number]   NDC: [address]-8

## 2019-07-25 ENCOUNTER — Ambulatory Visit: Payer: Federal, State, Local not specified - PPO | Attending: Internal Medicine

## 2019-07-25 DIAGNOSIS — Z23 Encounter for immunization: Secondary | ICD-10-CM

## 2019-07-25 NOTE — Progress Notes (Signed)
   Covid-19 Vaccination Clinic  Name:  Heather Hutchinson    MRN: [id number] DOB: Apr 08, 1976  07/25/2019  Ms. Bean was observed post Covid-19 immunization for 15 minutes without incident. She was provided with Vaccine Information Sheet and instruction to access the V-Safe system.   [name] was instructed to call 911 with any severe reactions post vaccine: Marland Kitchen Difficulty breathing  . Swelling of face and throat  . A fast heartbeat  . A bad rash all over body  . Dizziness and weakness   Immunizations Administered    Name Date Dose VIS Date Route   Pfizer COVID-19 Vaccine 07/25/2019  1:49 PM 0.3 mL 03/23/2019 Intramuscular   Manufacturer: [employer], [employer]   Lot: [id number]   NDC: [address]-9

## 2019-10-14 ENCOUNTER — Other Ambulatory Visit: Payer: Self-pay | Admitting: Primary Care

## 2019-10-14 DIAGNOSIS — F32A Depression, unspecified: Secondary | ICD-10-CM

## 2019-10-26 DIAGNOSIS — L249 Irritant contact dermatitis, unspecified cause: Secondary | ICD-10-CM | POA: Diagnosis not present

## 2019-10-26 DIAGNOSIS — B359 Dermatophytosis, unspecified: Secondary | ICD-10-CM | POA: Diagnosis not present

## 2019-12-07 ENCOUNTER — Other Ambulatory Visit: Payer: Self-pay | Admitting: Primary Care

## 2019-12-07 DIAGNOSIS — F32A Depression, unspecified: Secondary | ICD-10-CM

## 2019-12-07 NOTE — Telephone Encounter (Signed)
Pharmacy requests refill on: Sertraline 50mg  I po qd #90, 0rf  LAST REFILL: 03/13/19 for #90   LAST OV: 05/09/18 NEXT OV:  01/02/20 PHARMACY:  [facility]  Does not appear that patient is taking correctly as has been several months since filled.  Patient should have ran out in the spring.    Patient states she went off of it for a period of time and found an old bottle and is back on it taking it daily.  She said "she didn't do well off of it and needed to restart".  She wouldn't elaborate further with symptoms sounded like in a busy location.    I will pend this for provider's review to consider refill x 1 until patient is seen on 01/02/20.

## 2019-12-09 NOTE — Telephone Encounter (Signed)
I will provide her with a 30 day supply, this should get her through to her next appointment with me.  No further refills will be provided until she is seen.

## 2019-12-14 ENCOUNTER — Other Ambulatory Visit: Payer: Self-pay | Admitting: Primary Care

## 2019-12-14 DIAGNOSIS — Z1159 Encounter for screening for other viral diseases: Secondary | ICD-10-CM

## 2019-12-14 DIAGNOSIS — Z0000 Encounter for general adult medical examination without abnormal findings: Secondary | ICD-10-CM

## 2019-12-14 DIAGNOSIS — E559 Vitamin D deficiency, unspecified: Secondary | ICD-10-CM

## 2019-12-14 DIAGNOSIS — Z114 Encounter for screening for human immunodeficiency virus [HIV]: Secondary | ICD-10-CM

## 2019-12-27 ENCOUNTER — Other Ambulatory Visit: Payer: Self-pay

## 2019-12-27 ENCOUNTER — Other Ambulatory Visit (INDEPENDENT_AMBULATORY_CARE_PROVIDER_SITE_OTHER): Payer: Federal, State, Local not specified - PPO

## 2019-12-27 DIAGNOSIS — Z114 Encounter for screening for human immunodeficiency virus [HIV]: Secondary | ICD-10-CM | POA: Diagnosis not present

## 2019-12-27 DIAGNOSIS — Z0000 Encounter for general adult medical examination without abnormal findings: Secondary | ICD-10-CM

## 2019-12-27 DIAGNOSIS — Z1159 Encounter for screening for other viral diseases: Secondary | ICD-10-CM | POA: Diagnosis not present

## 2019-12-27 DIAGNOSIS — E559 Vitamin D deficiency, unspecified: Secondary | ICD-10-CM

## 2019-12-27 LAB — LIPID PANEL
Cholesterol: 155 mg/dL (ref 0–200)
HDL: 31 mg/dL — ABNORMAL LOW (ref >39.00)
LDL Cholesterol: 99 mg/dL (ref 0–99)
NonHDL: 124.42
Total CHOL/HDL Ratio: 5
Triglycerides: 127 mg/dL (ref 0.0–149.0)
VLDL: 25.4 mg/dL (ref 0.0–40.0)

## 2019-12-27 LAB — COMPREHENSIVE METABOLIC PANEL
ALT: 16 U/L (ref 0–35)
AST: 18 U/L (ref 0–37)
Albumin: 4.1 g/dL (ref 3.5–5.2)
Alkaline Phosphatase: 74 U/L (ref 39–117)
BUN: 9 mg/dL (ref 6–23)
CO2: 26 mEq/L (ref 19–32)
Calcium: 8.9 mg/dL (ref 8.4–10.5)
Chloride: 104 mEq/L (ref 96–112)
Creatinine, Ser: 0.8 mg/dL (ref 0.40–1.20)
GFR: 77.69 mL/min (ref >60.00)
Glucose, Bld: 92 mg/dL (ref 70–99)
Potassium: 4.3 mEq/L (ref 3.5–5.1)
Sodium: 136 mEq/L (ref 135–145)
Total Bilirubin: 0.3 mg/dL (ref 0.2–1.2)
Total Protein: 7.2 g/dL (ref 6.0–8.3)

## 2019-12-27 LAB — CBC
HCT: 36.2 % (ref 36.0–46.0)
Hemoglobin: 11.7 g/dL — ABNORMAL LOW (ref 12.0–15.0)
MCHC: 32.4 g/dL (ref 30.0–36.0)
MCV: 77.6 fl — ABNORMAL LOW (ref 78.0–100.0)
Platelets: 396 10*3/uL (ref 150.0–400.0)
RBC: 4.67 Mil/uL (ref 3.87–5.11)
RDW: 15.8 % — ABNORMAL HIGH (ref 11.5–15.5)
WBC: 10.2 10*3/uL (ref 4.0–10.5)

## 2019-12-27 LAB — VITAMIN D 25 HYDROXY (VIT D DEFICIENCY, FRACTURES): VITD: 39.18 ng/mL (ref 30.00–100.00)

## 2019-12-28 LAB — HEPATITIS C ANTIBODY
Hepatitis C Ab: NONREACTIVE
SIGNAL TO CUT-OFF: 0.18 (ref <1.00)

## 2019-12-28 LAB — HIV ANTIBODY (ROUTINE TESTING W REFLEX): HIV 1&2 Ab, 4th Generation: NONREACTIVE

## 2020-01-02 ENCOUNTER — Encounter: Payer: Self-pay | Admitting: Primary Care

## 2020-01-02 ENCOUNTER — Other Ambulatory Visit: Payer: Self-pay

## 2020-01-02 ENCOUNTER — Ambulatory Visit (INDEPENDENT_AMBULATORY_CARE_PROVIDER_SITE_OTHER): Payer: Federal, State, Local not specified - PPO | Admitting: Primary Care

## 2020-01-02 VITALS — BP 108/60 | HR 78 | Temp 98.2°F | Ht 66.0 in | Wt 217.0 lb

## 2020-01-02 DIAGNOSIS — Z0000 Encounter for general adult medical examination without abnormal findings: Secondary | ICD-10-CM | POA: Diagnosis not present

## 2020-01-02 DIAGNOSIS — R5382 Chronic fatigue, unspecified: Secondary | ICD-10-CM | POA: Insufficient documentation

## 2020-01-02 DIAGNOSIS — Z23 Encounter for immunization: Secondary | ICD-10-CM

## 2020-01-02 DIAGNOSIS — N920 Excessive and frequent menstruation with regular cycle: Secondary | ICD-10-CM | POA: Diagnosis not present

## 2020-01-02 DIAGNOSIS — F3342 Major depressive disorder, recurrent, in full remission: Secondary | ICD-10-CM

## 2020-01-02 MED ORDER — FLUOXETINE HCL 20 MG PO TABS: 20.0000 mg | ORAL_TABLET | Freq: Every day | ORAL | 1 refills | Status: DC

## 2020-01-02 NOTE — Assessment & Plan Note (Signed)
Unclear if the Covid-19 vaccines are to blame. Interestingly enough, she has monthly cycles despite continuous OCP's without placebo week.  She is due for GYN follow up, will be scheduling for next month.

## 2020-01-02 NOTE — Progress Notes (Signed)
Subjective:    Patient ID: Heather Hutchinson, female    DOB: [birth year], 44 y.o.   MRN: [id number]  HPI  This visit occurred during the SARS-CoV-2 public health emergency.  Safety protocols were in place, including screening questions prior to the visit, additional usage of staff PPE, and extensive cleaning of exam room while observing appropriate contact time as indicated for disinfecting solutions.   Heather Hutchinson is a 44 year old female who presents today for complete physical. She would also like to discuss fatigue and menorrhagia.   Chronic fatigue for years, but increased over the years. History of depression and is managed on Zoloft 50 mg. She does continue to feel not excited about anything, doesn't tend to feel happy, doesn't have interest. She's been taking Zoloft since May of 2017, doesn't really feel that it makes a difference. Once managed on Wellbutrin years ago, isn't sure why she came off. History of "OCD" years ago, believes she may have some ADD.  She does not exercise, also endorses a poor diet. She denies snoring and a history of sleep apnea.   History of light menses for years, but ever since she received the Covid-19 vaccine she's noticed an increased flow to her menses with breakthrough spotting in between menstrual cycles. She is going through 4-5 tampons daily which is unusual. She is compliant to her birth control for which she takes continuously without a placebo week. She will be seeing her GYN next month.    Immunizations: -Tetanus: Completed in 2017 -Influenza: Completed today -Covid-19: Completed series   Diet: She endorses a fair diet.  Exercise: She is not exercising   Eye exam: No recent exam Dental exam: No recent exam, is scheduled.   Pap Smear: Follows with GYN Mammogram: Completed in August 2020 at [facility] office.  BP Readings from Last 3 Encounters:  01/02/20 108/60  05/09/18 108/70  07/13/17 106/64   Wt Readings from Last 3 Encounters:    01/02/20 217 lb (98.4 kg)  05/09/18 217 lb 12 oz (98.8 kg)  07/13/17 222 lb (100.7 kg)     Review of Systems  Constitutional: Positive for fatigue. Negative for unexpected weight change.  HENT: Negative for rhinorrhea.   Respiratory: Negative for cough and shortness of breath.   Cardiovascular: Negative for chest pain.  Gastrointestinal: Negative for constipation and diarrhea.  Genitourinary: Positive for menstrual problem. Negative for difficulty urinating.  Musculoskeletal: Negative for arthralgias and myalgias.  Skin: Negative for rash.  Allergic/Immunologic: Negative for environmental allergies.  Neurological: Negative for dizziness, numbness and headaches.  Psychiatric/Behavioral:       See HPI       Past Medical History:  Diagnosis Date  . Genital warts   . Migraines   . Syncope      Social History   Socioeconomic History  . Marital status: Single    Spouse name: Not on file  . Number of children: Not on file  . Years of education: Not on file  . Highest education level: Not on file  Occupational History  . Not on file  Tobacco Use  . Smoking status: Current Every Day Smoker    Packs/day: 0.50    Types: Cigarettes  . Smokeless tobacco: Never Used  Substance and Sexual Activity  . Alcohol use: No    Alcohol/week: 0.0 standard drinks  . Drug use: No  . Sexual activity: Not on file  Other Topics Concern  . Not on file  Social History Narrative   Single.  No children.   Works in IT trainer.   Enjoys spending time with her dogs.    Social Determinants of Health   Financial Resource Strain:   . Difficulty of Paying Living Expenses: Not on file  Food Insecurity:   . Worried About Programme researcher, broadcasting/film/video in the Last Year: Not on file  . Ran Out of Food in the Last Year: Not on file  Transportation Needs:   . Lack of Transportation (Medical): Not on file  . Lack of Transportation (Non-Medical): Not on file  Physical Activity:   . Days of Exercise  per Week: Not on file  . Minutes of Exercise per Session: Not on file  Stress:   . Feeling of Stress : Not on file  Social Connections:   . Frequency of Communication with Friends and Family: Not on file  . Frequency of Social Gatherings with Friends and Family: Not on file  . Attends Religious Services: Not on file  . Active Member of Clubs or Organizations: Not on file  . Attends Banker Meetings: Not on file  . Marital Status: Not on file  Intimate Partner Violence:   . Fear of Current or Ex-Partner: Not on file  . Emotionally Abused: Not on file  . Physically Abused: Not on file  . Sexually Abused: Not on file    History reviewed. No pertinent surgical history.  Family History  Problem Relation Age of Onset  . Sudden death Brother     No Known Allergies  Current Outpatient Medications on File Prior to Visit  Medication Sig Dispense Refill  . CAMILA 0.35 MG tablet Take 1 tablet by mouth daily.  1  . predniSONE (DELTASONE) 5 MG tablet Take 1 tablet (5 mg total) by mouth as directed. Taper 6,5,4,3,2,1 21 tablet 0   No current facility-administered medications on file prior to visit.    BP 108/60   Pulse 78   Temp 98.2 F (36.8 C) (Temporal)   Ht 5\' 6"  (1.676 m)   Wt 217 lb (98.4 kg)   SpO2 95%   BMI 35.02 kg/m    Objective:   Physical Exam HENT:     Right Ear: Tympanic membrane and ear canal normal.     Left Ear: Tympanic membrane and ear canal normal.  Eyes:     Pupils: Pupils are equal, round, and reactive to light.  Cardiovascular:     Rate and Rhythm: Normal rate and regular rhythm.  Pulmonary:     Effort: Pulmonary effort is normal.     Breath sounds: Normal breath sounds.  Abdominal:     General: Bowel sounds are normal.     Palpations: Abdomen is soft.     Tenderness: There is no abdominal tenderness.  Musculoskeletal:        General: Normal range of motion.     Cervical back: Neck supple.  Skin:    General: Skin is warm and dry.    Neurological:     Mental Status: She is alert and oriented to person, place, and time.     Cranial Nerves: No cranial nerve deficit.     Deep Tendon Reflexes:     Reflex Scores:      Patellar reflexes are 2+ on the right side and 2+ on the left side. Psychiatric:        Mood and Affect: Mood normal.            Assessment & Plan:

## 2020-01-02 NOTE — Assessment & Plan Note (Signed)
Immunizations UTD. Pap smear due, follows with GYN. Discussed the importance of a healthy diet and regular exercise in order for weight loss, and to reduce the risk of any potential medical problems.  Exam today as noted. Labs reviewed.

## 2020-01-02 NOTE — Patient Instructions (Signed)
Stop sertraline (Zolof) 50 mg for depression and anxiety.  Start fluoxetine (Prozac) 20 mg daily for depression and anxiety.  Start exercising. You should be getting 150 minutes of moderate intensity exercise weekly.  It's important to improve your diet by reducing consumption of fast food, fried food, processed snack foods, sugary drinks. Increase consumption of fresh vegetables and fruits, whole grains, water.  Ensure you are drinking 64 ounces of water daily.  Follow up with your gynecologist as scheduled.  Please update me via My Chart (or come see me) in 1 month.   It was a pleasure to see you today!   Preventive Care 38-57 Years Old, Female Preventive care refers to visits with your health care provider and lifestyle choices that can promote health and wellness. This includes:  A yearly physical exam. This may also be called an annual well check.  Regular dental visits and eye exams.  Immunizations.  Screening for certain conditions.  Healthy lifestyle choices, such as eating a healthy diet, getting regular exercise, not using drugs or products that contain nicotine and tobacco, and limiting alcohol use. What can I expect for my preventive care visit? Physical exam Your health care provider will check your:  Height and weight. This may be used to calculate body mass index (BMI), which tells if you are at a healthy weight.  Heart rate and blood pressure.  Skin for abnormal spots. Counseling Your health care provider may ask you questions about your:  Alcohol, tobacco, and drug use.  Emotional well-being.  Home and relationship well-being.  Sexual activity.  Eating habits.  Work and work Statistician.  Method of birth control.  Menstrual cycle.  Pregnancy history. What immunizations do I need?  Influenza (flu) vaccine  This is recommended every year. Tetanus, diphtheria, and pertussis (Tdap) vaccine  You may need a Td booster every 10  years. Varicella (chickenpox) vaccine  You may need this if you have not been vaccinated. Zoster (shingles) vaccine  You may need this after age 50. Measles, mumps, and rubella (MMR) vaccine  You may need at least one dose of MMR if you were born in 1957 or later. You may also need a second dose. Pneumococcal conjugate (PCV13) vaccine  You may need this if you have certain conditions and were not previously vaccinated. Pneumococcal polysaccharide (PPSV23) vaccine  You may need one or two doses if you smoke cigarettes or if you have certain conditions. Meningococcal conjugate (MenACWY) vaccine  You may need this if you have certain conditions. Hepatitis A vaccine  You may need this if you have certain conditions or if you travel or work in places where you may be exposed to hepatitis A. Hepatitis B vaccine  You may need this if you have certain conditions or if you travel or work in places where you may be exposed to hepatitis B. Haemophilus influenzae type b (Hib) vaccine  You may need this if you have certain conditions. Human papillomavirus (HPV) vaccine  If recommended by your health care provider, you may need three doses over 6 months. You may receive vaccines as individual doses or as more than one vaccine together in one shot (combination vaccines). Talk with your health care provider about the risks and benefits of combination vaccines. What tests do I need? Blood tests  Lipid and cholesterol levels. These may be checked every 5 years, or more frequently if you are over 50 years old.  Hepatitis C test.  Hepatitis B test. Screening  Lung cancer  screening. You may have this screening every year starting at age 12 if you have a 30-pack-year history of smoking and currently smoke or have quit within the past 15 years.  Colorectal cancer screening. All adults should have this screening starting at age 41 and continuing until age 22. Your health care provider may  recommend screening at age 83 if you are at increased risk. You will have tests every 1-10 years, depending on your results and the type of screening test.  Diabetes screening. This is done by checking your blood sugar (glucose) after you have not eaten for a while (fasting). You may have this done every 1-3 years.  Mammogram. This may be done every 1-2 years. Talk with your health care provider about when you should start having regular mammograms. This may depend on whether you have a family history of breast cancer.  BRCA-related cancer screening. This may be done if you have a family history of breast, ovarian, tubal, or peritoneal cancers.  Pelvic exam and Pap test. This may be done every 3 years starting at age 6. Starting at age 80, this may be done every 5 years if you have a Pap test in combination with an HPV test. Other tests  Sexually transmitted disease (STD) testing.  Bone density scan. This is done to screen for osteoporosis. You may have this scan if you are at high risk for osteoporosis. Follow these instructions at home: Eating and drinking  Eat a diet that includes fresh fruits and vegetables, whole grains, lean protein, and low-fat dairy.  Take vitamin and mineral supplements as recommended by your health care provider.  Do not drink alcohol if: ? Your health care provider tells you not to drink. ? You are pregnant, may be pregnant, or are planning to become pregnant.  If you drink alcohol: ? Limit how much you have to 0-1 drink a day. ? Be aware of how much alcohol is in your drink. In the [place] one drink equals one 12 oz bottle of beer (355 mL), one 5 oz glass of wine (148 mL), or one 1 oz glass of hard liquor (44 mL). Lifestyle  Take daily care of your teeth and gums.  Stay active. Exercise for at least 30 minutes on 5 or more days each week.  Do not use any products that contain nicotine or tobacco, such as cigarettes, e-cigarettes, and chewing tobacco. If  you need help quitting, ask your health care provider.  If you are sexually active, practice safe sex. Use a condom or other form of birth control (contraception) in order to prevent pregnancy and STIs (sexually transmitted infections).  If told by your health care provider, take low-dose aspirin daily starting at age 85. What's next?  Visit your health care provider once a year for a well check visit.  Ask your health care provider how often you should have your eyes and teeth checked.  Stay up to date on all vaccines. This information is not intended to replace advice given to you by your health care provider. Make sure you discuss any questions you have with your health care provider. Document Revised: 12/08/2017 Document Reviewed: 12/08/2017 Elsevier Patient Education  2020 [employer].

## 2020-01-02 NOTE — Assessment & Plan Note (Signed)
Doesn't appear well controlled despite use of Zoloft. Given fatigue, will stop Zoloft and trial Prozac.   She will update in 1 month.

## 2020-01-02 NOTE — Assessment & Plan Note (Addendum)
Chronic for years.  Causes include depression, deconditioning, thyroid disorder, vitamin deficiency.  Checking labs today including TSH and B12. Other labs, including CBC, unremarkable.  Will stop Zoloft and switch to Prozac. She will update in one month.   Strongly recommended daily exercise, work on a healthy diet.

## 2020-01-03 LAB — TSH: TSH: 3.72 u[IU]/mL (ref 0.35–4.50)

## 2020-01-03 LAB — VITAMIN B12: Vitamin B-12: 319 pg/mL (ref 211–911)

## 2020-01-17 ENCOUNTER — Other Ambulatory Visit: Payer: Self-pay | Admitting: Primary Care

## 2020-01-17 DIAGNOSIS — F3342 Major depressive disorder, recurrent, in full remission: Secondary | ICD-10-CM

## 2020-02-12 DIAGNOSIS — Z6835 Body mass index (BMI) 35.0-35.9, adult: Secondary | ICD-10-CM | POA: Diagnosis not present

## 2020-02-12 DIAGNOSIS — Z1231 Encounter for screening mammogram for malignant neoplasm of breast: Secondary | ICD-10-CM | POA: Diagnosis not present

## 2020-02-12 DIAGNOSIS — Z01419 Encounter for gynecological examination (general) (routine) without abnormal findings: Secondary | ICD-10-CM | POA: Diagnosis not present

## 2020-02-12 LAB — HM MAMMOGRAPHY

## 2020-02-18 ENCOUNTER — Encounter: Payer: Self-pay | Admitting: Primary Care

## 2020-04-02 DIAGNOSIS — Z1159 Encounter for screening for other viral diseases: Secondary | ICD-10-CM | POA: Diagnosis not present

## 2020-06-09 IMAGING — US US ABDOMEN COMPLETE
1 series · 14 of 25 positions shown · non-contrast
Comparison: November 17, 2016

CLINICAL DATA: Right upper quadrant abdomen pain

EXAM:
ABDOMEN ULTRASOUND COMPLETE

[Series 1: us abdomen complete · 0.18mm/px · 14 of 94 slices shown]
[im 1/94]
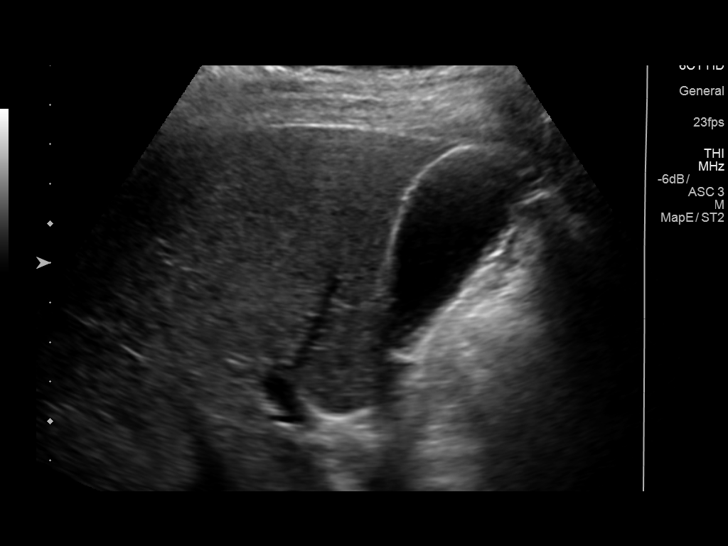
[im 8/94]
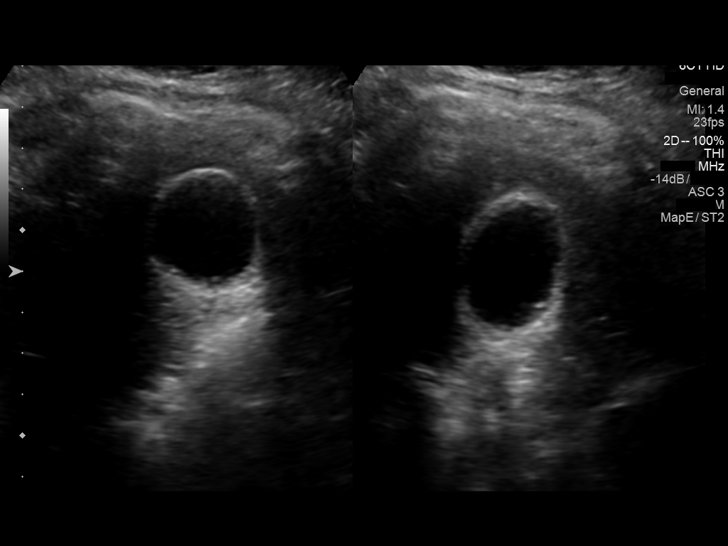
[im 16/94]
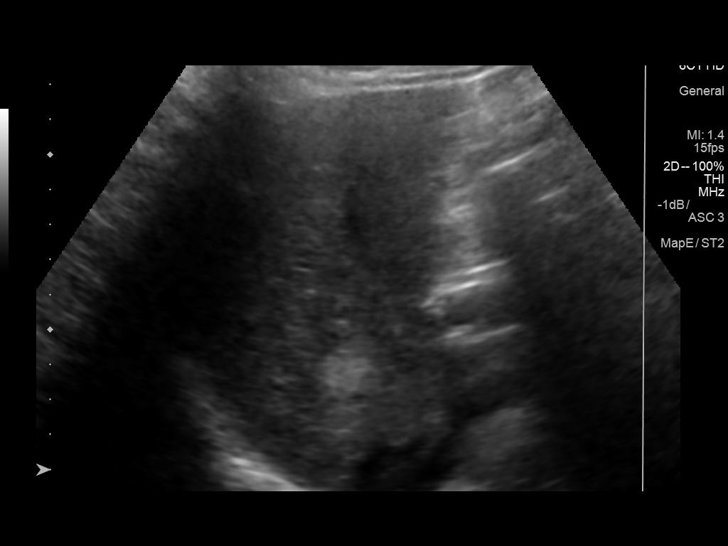
[im 24/94]
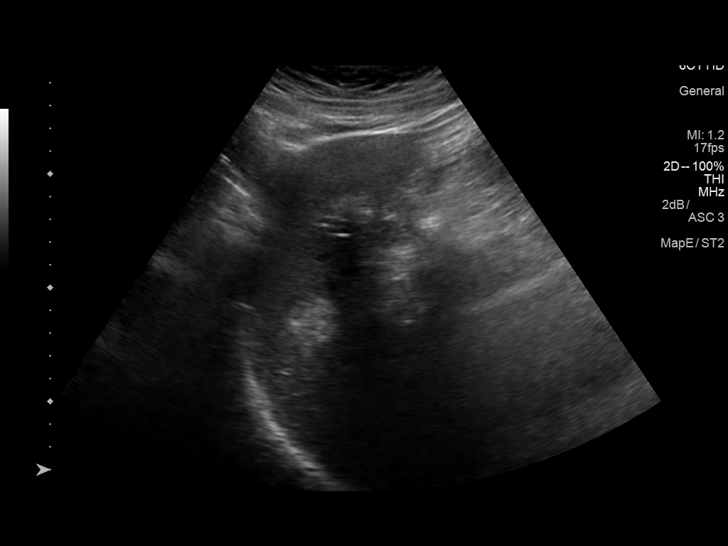
[im 32/94]
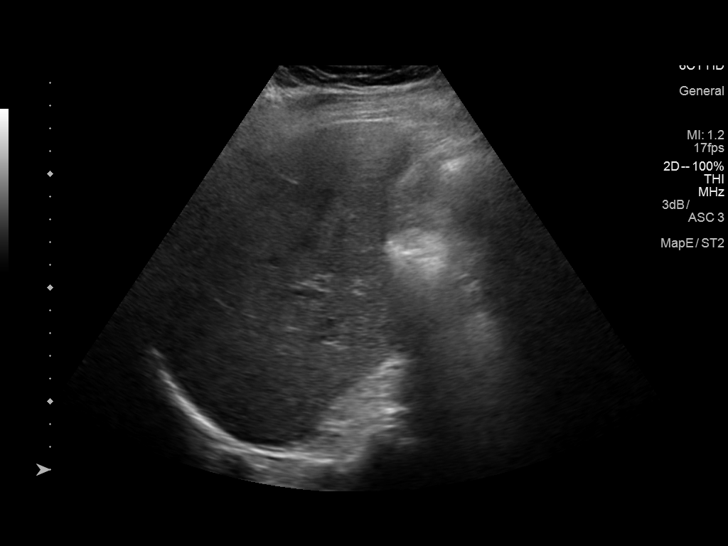
[im 35/94]
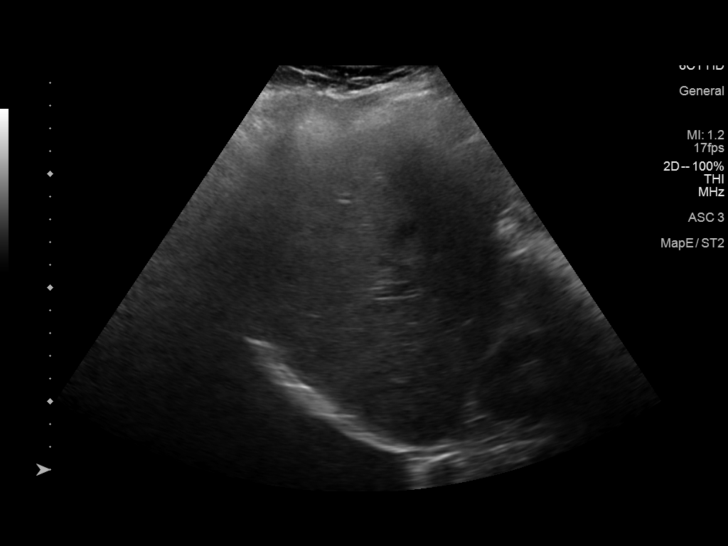
[im 43/94]
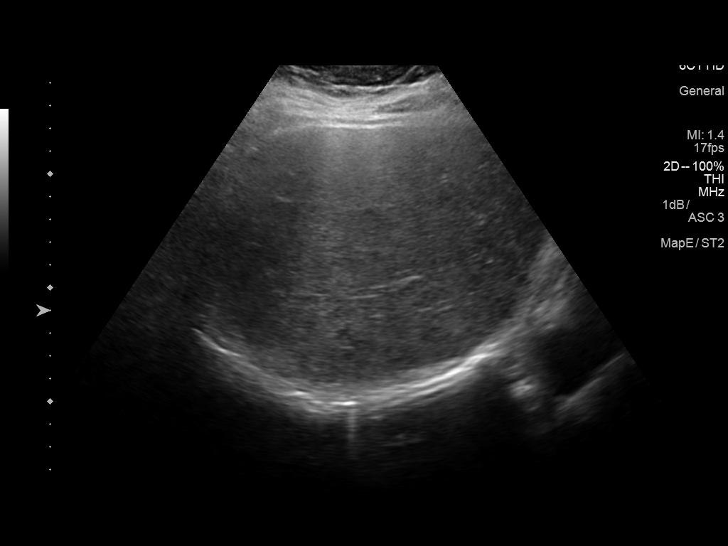
[im 51/94]
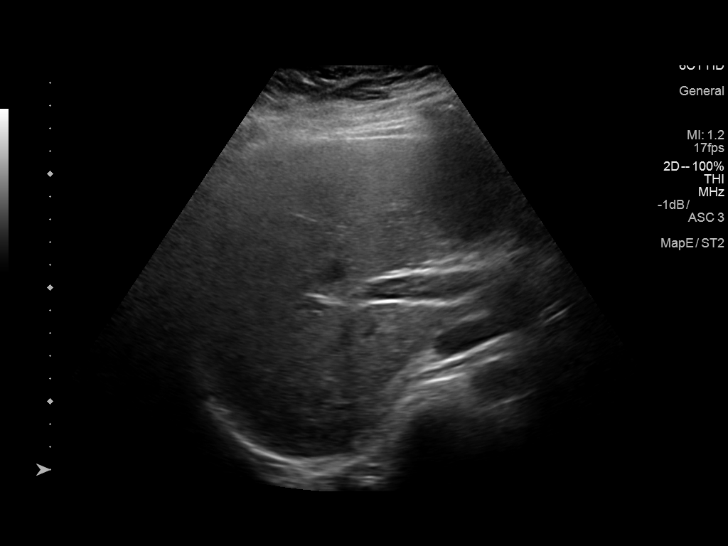
[im 59/94]
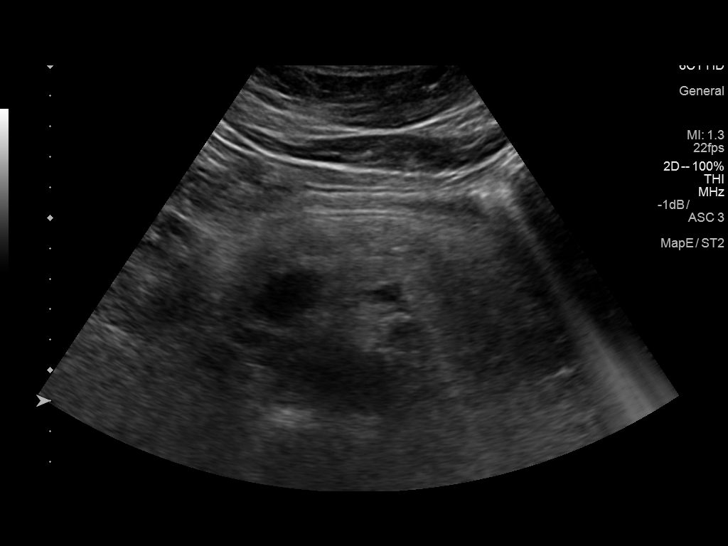
[im 63/94]
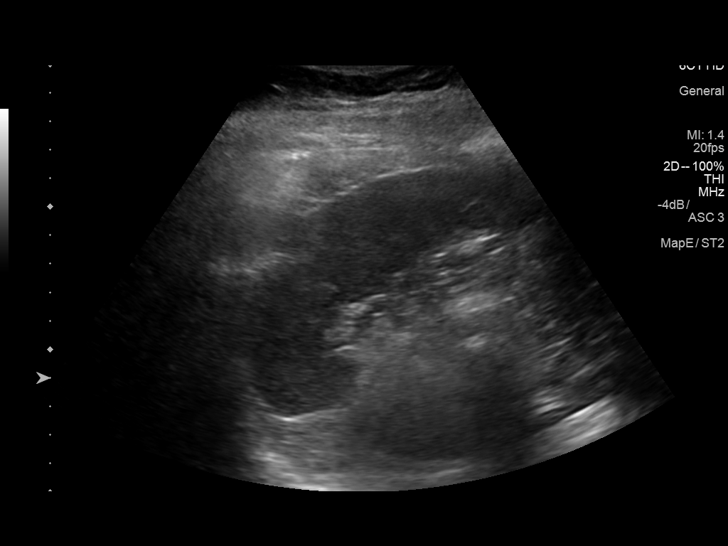
[im 70/94]
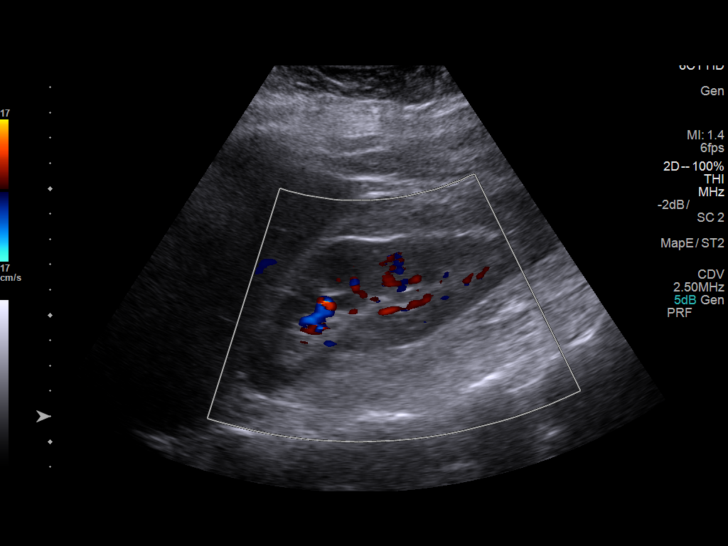
[im 78/94]
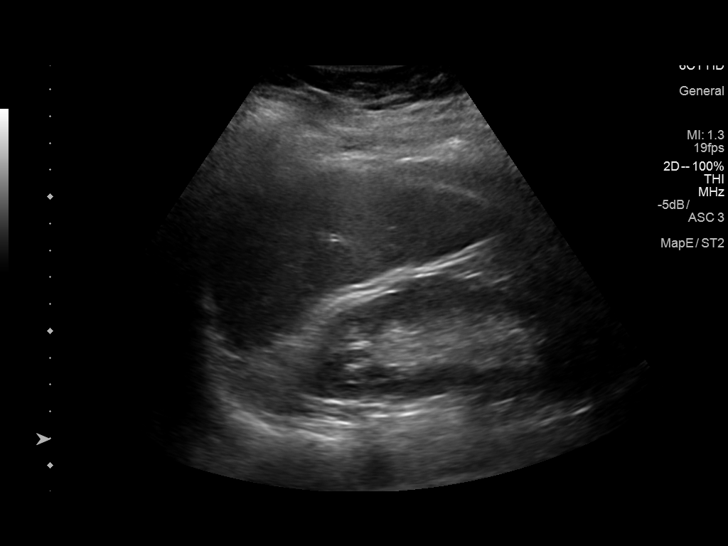
[im 86/94]
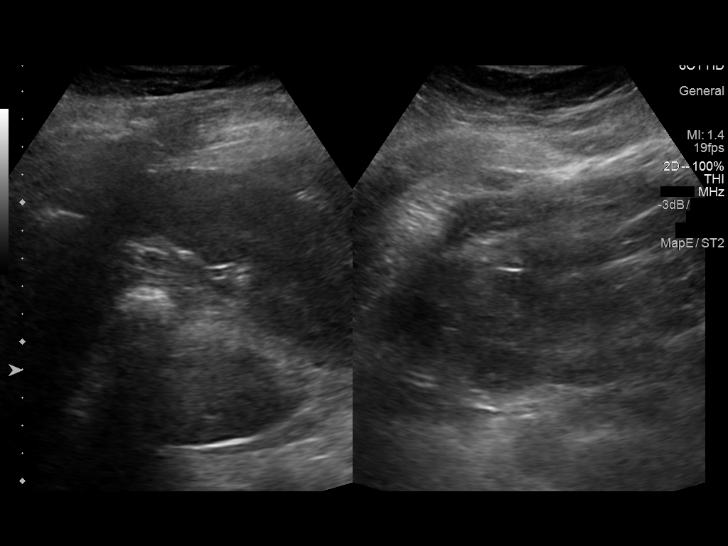
[im 94/94]
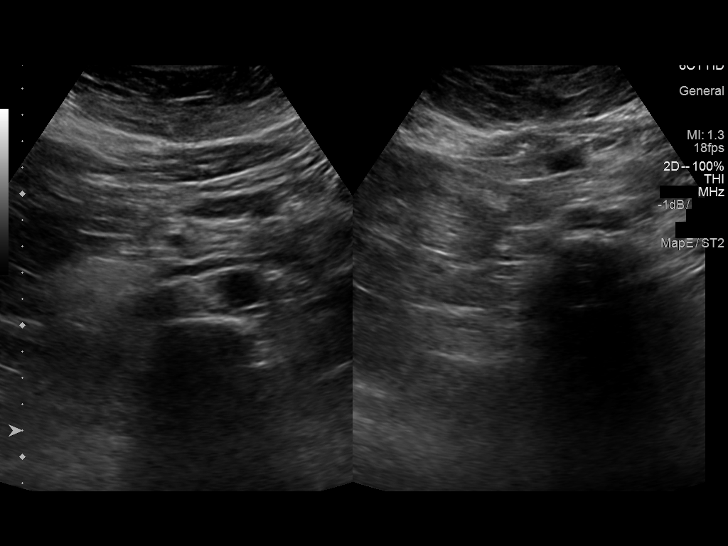

[14 of 25 positions shown; findings below may reference images not displayed]

FINDINGS: Gallbladder: No gallstones or wall thickening visualized. 3 mm
gallbladder polyp is noted. No sonographic Murphy sign noted by
sonographer.

Common bile duct: Diameter: 3.5 mm

Liver: There is a 2.2 x 2.2 x 2.4 cm hyperechoic lesion in the right
lobe liver. This is unchanged compared prior ultrasound. Within
normal limits in parenchymal echogenicity. Portal vein is patent on
color Doppler imaging with normal direction of blood flow towards
the liver.

IVC: No abnormality visualized.

Pancreas: Visualized portion unremarkable.

Spleen: Size and appearance within normal limits.

Right Kidney: Length: 12.7 cm. Echogenicity within normal limits. No
mass or hydronephrosis visualized.

Left Kidney: Length: 12.8 cm. Echogenicity within normal limits. No
mass or hydronephrosis visualized.

Abdominal aorta: No aneurysm visualized.

Other findings: None.
IMPRESSION: No acute abnormality identified.

Stable hyperechoic mass in the right lobe liver unchanged compared
prior ultrasound of 0477.

3 mm gallbladder polyp.

## 2020-08-14 ENCOUNTER — Other Ambulatory Visit: Payer: Self-pay | Admitting: Primary Care

## 2020-08-14 DIAGNOSIS — F3342 Major depressive disorder, recurrent, in full remission: Secondary | ICD-10-CM

## 2020-11-12 ENCOUNTER — Other Ambulatory Visit: Payer: Self-pay | Admitting: Family Medicine

## 2020-11-12 DIAGNOSIS — F3342 Major depressive disorder, recurrent, in full remission: Secondary | ICD-10-CM

## 2021-03-23 DIAGNOSIS — Z01411 Encounter for gynecological examination (general) (routine) with abnormal findings: Secondary | ICD-10-CM | POA: Diagnosis not present

## 2021-03-23 DIAGNOSIS — Z113 Encounter for screening for infections with a predominantly sexual mode of transmission: Secondary | ICD-10-CM | POA: Diagnosis not present

## 2021-03-23 DIAGNOSIS — Z124 Encounter for screening for malignant neoplasm of cervix: Secondary | ICD-10-CM | POA: Diagnosis not present

## 2021-03-23 DIAGNOSIS — Z1231 Encounter for screening mammogram for malignant neoplasm of breast: Secondary | ICD-10-CM | POA: Diagnosis not present

## 2021-03-23 DIAGNOSIS — Z01419 Encounter for gynecological examination (general) (routine) without abnormal findings: Secondary | ICD-10-CM | POA: Diagnosis not present

## 2021-03-23 DIAGNOSIS — Z3041 Encounter for surveillance of contraceptive pills: Secondary | ICD-10-CM | POA: Diagnosis not present

## 2021-03-23 LAB — HM MAMMOGRAPHY

## 2021-03-27 ENCOUNTER — Encounter: Payer: Self-pay | Admitting: Primary Care

## 2021-03-31 LAB — HM PAP SMEAR
HPV 16/18/45 genotyping: NEGATIVE
HPV, high-risk: NEGATIVE

## 2021-05-04 ENCOUNTER — Other Ambulatory Visit: Payer: Self-pay | Admitting: Primary Care

## 2021-05-04 DIAGNOSIS — F3342 Major depressive disorder, recurrent, in full remission: Secondary | ICD-10-CM

## 2021-05-04 NOTE — Telephone Encounter (Signed)
Patient has not been seen since Sept 2021 and will need to be seen ASAP for further refills. Okay to schedule for CPE/follow up, whichever she wants, at soonest available date.  Let me know when scheduled.

## 2021-05-05 NOTE — Telephone Encounter (Signed)
Not able to leave voice mail.

## 2021-05-07 NOTE — Telephone Encounter (Signed)
No answer no voice mail  

## 2021-05-14 NOTE — Telephone Encounter (Signed)
Following mychart sent to pt: Hello Heather Hutchinson, we have been trying to reach you but have not had any luck, nor could we leave a VM. Due to you not being seen since Sept 2021, you will need to be seen ASAP for further refills of your prozac. Please call to schedule an appointment for either an annual physical or a follow up at soonest available date, by calling [phone].  Thank you, Neysa Bonito

## 2021-05-18 NOTE — Telephone Encounter (Signed)
Noted. Will wait until we can meet to discuss whether or not she'd like to resume fluoxetine.

## 2021-05-18 NOTE — Telephone Encounter (Signed)
Called patient I have made CPE for 2/23 she has not been taking mediations if you want her to restart she will need called in.

## 2021-06-04 ENCOUNTER — Ambulatory Visit: Payer: Federal, State, Local not specified - PPO | Admitting: Primary Care

## 2021-06-04 ENCOUNTER — Other Ambulatory Visit: Payer: Self-pay

## 2021-06-04 ENCOUNTER — Encounter: Payer: Self-pay | Admitting: Primary Care

## 2021-06-04 VITALS — BP 110/64 | HR 81 | Temp 98.6°F | Ht 66.0 in | Wt 224.0 lb

## 2021-06-04 DIAGNOSIS — F331 Major depressive disorder, recurrent, moderate: Secondary | ICD-10-CM | POA: Diagnosis not present

## 2021-06-04 DIAGNOSIS — Z1211 Encounter for screening for malignant neoplasm of colon: Secondary | ICD-10-CM | POA: Diagnosis not present

## 2021-06-04 DIAGNOSIS — Z0000 Encounter for general adult medical examination without abnormal findings: Secondary | ICD-10-CM | POA: Diagnosis not present

## 2021-06-04 DIAGNOSIS — R519 Headache, unspecified: Secondary | ICD-10-CM

## 2021-06-04 DIAGNOSIS — N920 Excessive and frequent menstruation with regular cycle: Secondary | ICD-10-CM

## 2021-06-04 DIAGNOSIS — F3342 Major depressive disorder, recurrent, in full remission: Secondary | ICD-10-CM

## 2021-06-04 MED ORDER — FLUOXETINE HCL 20 MG PO TABS: ORAL_TABLET | ORAL | 3 refills | Status: DC

## 2021-06-04 NOTE — Assessment & Plan Note (Signed)
Declines influenza vaccine. Other vaccines UTD. Pap smear UTD  Mammogram UTD.  Colonoscopy due, referral placed to GI  Discussed the importance of a healthy diet and regular exercise in order for weight loss, and to reduce the risk of further co-morbidity.  Exam today stable Labs pending.

## 2021-06-04 NOTE — Assessment & Plan Note (Signed)
Agreed to resume fluoxetine 20 mg.  Discussed to start with half tablet daily for 1 week, then increase to 1 full tablet thereafter.   Refills sent to pharmacy

## 2021-06-04 NOTE — Assessment & Plan Note (Signed)
No concerns today. Continue to monitor. 

## 2021-06-04 NOTE — Patient Instructions (Signed)
Stop by the lab prior to leaving today. I will notify you of your results once received.   Resume fluoxetine 20 mg capsules for depression/anxiety.  Start by taking 1/2 tablet by mouth once daily for a week, then increase to 1 full tablet thereafter.  It was a pleasure to see you today!  Preventive Care 92-46 Years Old, Female Preventive care refers to lifestyle choices and visits with your health care provider that can promote health and wellness. Preventive care visits are also called wellness exams. What can I expect for my preventive care visit? Counseling Your health care provider may ask you questions about your: Medical history, including: Past medical problems. Family medical history. Pregnancy history. Current health, including: Menstrual cycle. Method of birth control. Emotional well-being. Home life and relationship well-being. Sexual activity and sexual health. Lifestyle, including: Alcohol, nicotine or tobacco, and drug use. Access to firearms. Diet, exercise, and sleep habits. Work and work Statistician. Sunscreen use. Safety issues such as seatbelt and bike helmet use. Physical exam Your health care provider will check your: Height and weight. These may be used to calculate your BMI (body mass index). BMI is a measurement that tells if you are at a healthy weight. Waist circumference. This measures the distance around your waistline. This measurement also tells if you are at a healthy weight and may help predict your risk of certain diseases, such as type 2 diabetes and high blood pressure. Heart rate and blood pressure. Body temperature. Skin for abnormal spots. What immunizations do I need? Vaccines are usually given at various ages, according to a schedule. Your health care provider will recommend vaccines for you based on your age, medical history, and lifestyle or other factors, such as travel or where you work. What tests do I need? Screening Your health  care provider may recommend screening tests for certain conditions. This may include: Lipid and cholesterol levels. Diabetes screening. This is done by checking your blood sugar (glucose) after you have not eaten for a while (fasting). Pelvic exam and Pap test. Hepatitis B test. Hepatitis C test. HIV (human immunodeficiency virus) test. STI (sexually transmitted infection) testing, if you are at risk. Lung cancer screening. Colorectal cancer screening. Mammogram. Talk with your health care provider about when you should start having regular mammograms. This may depend on whether you have a family history of breast cancer. BRCA-related cancer screening. This may be done if you have a family history of breast, ovarian, tubal, or peritoneal cancers. Bone density scan. This is done to screen for osteoporosis. Talk with your health care provider about your test results, treatment options, and if necessary, the need for more tests. Follow these instructions at home: Eating and drinking  Eat a diet that includes fresh fruits and vegetables, whole grains, lean protein, and low-fat dairy products. Take vitamin and mineral supplements as recommended by your health care provider. Do not drink alcohol if: Your health care provider tells you not to drink. You are pregnant, may be pregnant, or are planning to become pregnant. If you drink alcohol: Limit how much you have to 0-1 drink a day. Know how much alcohol is in your drink. In the [place] one drink equals one 12 oz bottle of beer (355 mL), one 5 oz glass of wine (148 mL), or one 1 oz glass of hard liquor (44 mL). Lifestyle Brush your teeth every morning and night with fluoride toothpaste. Floss one time each day. Exercise for at least 30 minutes 5 or more days  each week. Do not use any products that contain nicotine or tobacco. These products include cigarettes, chewing tobacco, and vaping devices, such as e-cigarettes. If you need help quitting,  ask your health care provider. Do not use drugs. If you are sexually active, practice safe sex. Use a condom or other form of protection to prevent STIs. If you do not wish to become pregnant, use a form of birth control. If you plan to become pregnant, see your health care provider for a prepregnancy visit. Take aspirin only as told by your health care provider. Make sure that you understand how much to take and what form to take. Work with your health care provider to find out whether it is safe and beneficial for you to take aspirin daily. Find healthy ways to manage stress, such as: Meditation, yoga, or listening to music. Journaling. Talking to a trusted person. Spending time with friends and family. Minimize exposure to UV radiation to reduce your risk of skin cancer. Safety Always wear your seat belt while driving or riding in a vehicle. Do not drive: If you have been drinking alcohol. Do not ride with someone who has been drinking. When you are tired or distracted. While texting. If you have been using any mind-altering substances or drugs. Wear a helmet and other protective equipment during sports activities. If you have firearms in your house, make sure you follow all gun safety procedures. Seek help if you have been physically or sexually abused. What's next? Visit your health care provider once a year for an annual wellness visit. Ask your health care provider how often you should have your eyes and teeth checked. Stay up to date on all vaccines. This information is not intended to replace advice given to you by your health care provider. Make sure you discuss any questions you have with your health care provider. Document Revised: 09/24/2020 Document Reviewed: 09/24/2020 Elsevier Patient Education  [employer].

## 2021-06-04 NOTE — Progress Notes (Signed)
Subjective:    Patient ID: Heather Hutchinson, female    DOB: Aug 16, 1975, 46 y.o.   MRN: [id number]  HPI  Heather Hutchinson is a very pleasant 46 y.o. female who presents today for complete physical and follow up of chronic conditions.  She stopped taking her Prozac about 6 months ago. She would like to resume her Prozac for symptoms of little motivation Hutchinson things, feeling down, feeling anxious, sleeping. She did well when taking Prozac previously.   Immunizations: -Tetanus: 2017 -Influenza: Declines  -Covid-19: 2 vaccines  Diet: Fair diet.  Exercise: No regular exercise.   Eye exam: Completes annually  Dental exam: Completes semi-annually   Pap Smear: Completed per GYN Mammogram: Completed in December 2022 Colonoscopy: Completed 17 years ago   BP Readings from Last 3 Encounters:  06/04/21 110/64  01/02/20 108/60  05/09/18 108/70      Review of Systems  Constitutional:  Negative for unexpected weight change.  HENT:  Negative for rhinorrhea.   Respiratory:  Negative for cough and shortness of breath.   Cardiovascular:  Negative for chest pain.  Gastrointestinal:  Negative for constipation and diarrhea.  Genitourinary:  Negative for difficulty urinating and menstrual problem.  Musculoskeletal:  Negative for arthralgias and myalgias.  Skin:  Negative for rash.  Allergic/Immunologic: Negative for environmental allergies.  Neurological:  Negative for dizziness, numbness and headaches.  Psychiatric/Behavioral:  The patient is nervous/anxious.        See HPI        Past Medical History:  Diagnosis Date   Genital warts    Migraines    Syncope     Social History   Socioeconomic History   Marital status: Single    Spouse name: Not on file   Number of children: Not on file   Years of education: Not on file   Highest education level: Not on file  Occupational History   Not on file  Tobacco Use   Smoking status: Every Day    Packs/day: 0.50    Types:  Cigarettes   Smokeless tobacco: Never  Substance and Sexual Activity   Alcohol use: No    Alcohol/week: 0.0 standard drinks   Drug use: No   Sexual activity: Not on file  Other Topics Concern   Not on file  Social History Narrative   Single.   No children.   Works in IT trainer.   Enjoys spending time with her dogs.    Social Determinants of Health   Financial Resource Strain: Not on file  Food Insecurity: Not on file  Transportation Needs: Not on file  Physical Activity: Not on file  Stress: Not on file  Social Connections: Not on file  Intimate Partner Violence: Not on file    History reviewed. No pertinent surgical history.  Family History  Problem Relation Age of Onset   Sudden death Brother     No Known Allergies  Current Outpatient Medications on File Prior to Visit  Medication Sig Dispense Refill   CAMILA 0.35 MG tablet Take 1 tablet by mouth daily.  1   FLUoxetine (PROZAC) 20 MG tablet TAKE 1 TABLET BY MOUTH EVERY DAY FOR DEPRESSION (Patient not taking: Reported on 06/04/2021) 90 tablet 0   No current facility-administered medications on file prior to visit.    BP 110/64    Pulse 81    Temp 98.6 F (37 C) (Oral)    Ht 5\' 6"  (1.676 m)    Wt 224 lb (101.6 kg)  SpO2 96%    BMI 36.15 kg/m  Objective:   Physical Exam HENT:     Right Ear: Tympanic membrane and ear canal normal.     Left Ear: Tympanic membrane and ear canal normal.     Nose: Nose normal.  Eyes:     Conjunctiva/sclera: Conjunctivae normal.     Pupils: Pupils are equal, round, and reactive to light.  Neck:     Thyroid: No thyromegaly.  Cardiovascular:     Rate and Rhythm: Normal rate and regular rhythm.     Heart sounds: No murmur heard. Pulmonary:     Effort: Pulmonary effort is normal.     Breath sounds: Normal breath sounds. No rales.  Abdominal:     General: Bowel sounds are normal.     Palpations: Abdomen is soft.     Tenderness: There is no abdominal tenderness.   Musculoskeletal:        General: Normal range of motion.     Cervical back: Neck supple.  Lymphadenopathy:     Cervical: No cervical adenopathy.  Skin:    General: Skin is warm and dry.     Findings: No rash.  Neurological:     Mental Status: She is alert and oriented to person, place, and time.     Cranial Nerves: No cranial nerve deficit.     Deep Tendon Reflexes: Reflexes are normal and symmetric.  Psychiatric:        Mood and Affect: Mood normal.          Assessment & Plan:      This visit occurred during the SARS-CoV-2 public health emergency.  Safety protocols were in place, including screening questions prior to the visit, additional usage of staff PPE, and extensive cleaning of exam room while observing appropriate contact time as indicated for disinfecting solutions.

## 2021-06-04 NOTE — Addendum Note (Signed)
Addended by: Doreene Nest on: 06/04/2021 02:48 PM   Modules accepted: Orders

## 2021-06-04 NOTE — Assessment & Plan Note (Signed)
Stable. No concerns today.  Continue Camilla 0.35 mg tablets daily.

## 2021-06-05 LAB — CBC
HCT: 38.9 % (ref 36.0–46.0)
Hemoglobin: 12.7 g/dL (ref 12.0–15.0)
MCHC: 32.6 g/dL (ref 30.0–36.0)
MCV: 81.7 fl (ref 78.0–100.0)
Platelets: 336 10*3/uL (ref 150.0–400.0)
RBC: 4.76 Mil/uL (ref 3.87–5.11)
RDW: 14.6 % (ref 11.5–15.5)
WBC: 9.3 10*3/uL (ref 4.0–10.5)

## 2021-06-05 LAB — HEMOGLOBIN A1C: Hgb A1c MFr Bld: 5.6 % (ref 4.6–6.5)

## 2021-06-05 LAB — COMPREHENSIVE METABOLIC PANEL
ALT: 14 U/L (ref 0–35)
AST: 18 U/L (ref 0–37)
Albumin: 4.3 g/dL (ref 3.5–5.2)
Alkaline Phosphatase: 65 U/L (ref 39–117)
BUN: 8 mg/dL (ref 6–23)
CO2: 31 mEq/L (ref 19–32)
Calcium: 9.3 mg/dL (ref 8.4–10.5)
Chloride: 102 mEq/L (ref 96–112)
Creatinine, Ser: 0.81 mg/dL (ref 0.40–1.20)
GFR: 87.26 mL/min (ref >60.00)
Glucose, Bld: 82 mg/dL (ref 70–99)
Potassium: 4.4 mEq/L (ref 3.5–5.1)
Sodium: 137 mEq/L (ref 135–145)
Total Bilirubin: 0.3 mg/dL (ref 0.2–1.2)
Total Protein: 7.4 g/dL (ref 6.0–8.3)

## 2021-06-05 LAB — LDL CHOLESTEROL, DIRECT: Direct LDL: 101 mg/dL

## 2021-06-05 LAB — LIPID PANEL
Cholesterol: 155 mg/dL (ref 0–200)
HDL: 29.3 mg/dL — ABNORMAL LOW (ref >39.00)
NonHDL: 125.92
Total CHOL/HDL Ratio: 5
Triglycerides: 238 mg/dL — ABNORMAL HIGH (ref 0.0–149.0)
VLDL: 47.6 mg/dL — ABNORMAL HIGH (ref 0.0–40.0)

## 2021-06-12 ENCOUNTER — Encounter: Payer: Self-pay | Admitting: *Deleted

## 2021-10-15 DIAGNOSIS — Z1211 Encounter for screening for malignant neoplasm of colon: Secondary | ICD-10-CM | POA: Diagnosis not present

## 2021-10-15 DIAGNOSIS — K625 Hemorrhage of anus and rectum: Secondary | ICD-10-CM | POA: Diagnosis not present

## 2021-10-16 ENCOUNTER — Other Ambulatory Visit: Payer: Self-pay | Admitting: General Surgery

## 2021-10-16 NOTE — Progress Notes (Signed)
Progress Notes - documented in this encounter Kele Barthelemy, Albina Billet, MD - 10/15/2021 2:30 PM EDT Formatting of this note is different from the original. Subjective:   Patient ID: Heather Hutchinson is a 46 y.o. female.  HPI  The following portions of the patient's history were reviewed and updated as appropriate.  This a new patient is here today for: office visit. The patient is here today to discuss a colonoscopy. Both of her parents are patient's with this office. Patient reports bright red blood per rectum with most bowel movements. She reports it "sprays" in the toilet bowl. Patient states she has bowel movements most days to every other day. No associated pain. No bleeding between episodes of bowel movements.  The patient reports occasional discomfort in the right mid abdomen, especially when she reaches to wash her feet or to put on stockings. No postprandial pain or consistent symptoms of reflux.  Patient reports she had a colonoscopy around 17 years ago due to having rectal bleeding.  The patient reports that blood will "spray health once The patient's parents, Rashaunda Rahl and Elysha Daw are both patient's of mine. Colonoscopies in 2022 did not show evidence of polyps.  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  Chief Complaint  Patient presents with  Pre-op Exam    BP 126/64  Pulse 93  Temp 36.8 C (98.2 F)  Ht 165.1 cm (5\' 5" )  Wt 98 kg (216 lb)  LMP 10/08/2021 (Approximate)  SpO2 98%  BMI 35.94 kg/m   Past Medical History:  Diagnosis Date  Genital warts  Migraines  Syncope    Past Surgical History:  Procedure Laterality Date  COLONOSCOPY  incision and drainage pilonidal cyst  wisdom teeth    OB History   Gravida  0  Para  0  Term  0  Preterm  0  AB  0  Living  0    SAB  0  IAB  0  Ectopic  0  Molar  0  Multiple  0  Live Births  0    Obstetric Comments  Age at first  period 5      Social History   Socioeconomic History  Marital status: Unknown  Tobacco Use  Smoking status: Every Day  Packs/day: 0.50  Types: Cigarettes  Smokeless tobacco: Never  Substance and Sexual Activity  Alcohol use: Never  Drug use: Never    No Known Allergies  Current Outpatient Medications  Medication Sig Dispense Refill  FLUoxetine (PROZAC) 20 MG tablet Take 20 mg by mouth once daily  norethindrone (MICRONOR) 0.35 mg tablet Take 1 tablet by mouth once daily   No current facility-administered medications for this visit.   Family History  Problem Relation Age of Onset  Sudden death (unexpected death due to unknown cause) Brother  Breast cancer Neg Hx  Colon cancer Neg Hx   Labs and Radiology:   June 04, 2021 laboratory:  Sodium 135 - 145 mEq/L 137  Potassium 3.5 - 5.1 mEq/L 4.4  Chloride 96 - 112 mEq/L 102  CO2 19 - 32 mEq/L 31  Glucose, Bld 70 - 99 mg/dL 82  BUN 6 - 23 mg/dL 8  Creatinine, Ser June 06, 2021 - 1.20 mg/dL 8.25  Total Bilirubin 0.2 - 1.2 mg/dL 0.3  Alkaline Phosphatase 39 - 117 U/L 65  AST 0 - 37 U/L 18  ALT 0 - 35 U/L 14  Total Protein 6.0 - 8.3 g/dL 7.4  Albumin 3.5 -  5.2 g/dL 4.3  GFR >50.53 mL/min 87.26  Comment: Calculated using the CKD-EPI Creatinine Equation (2021) Calcium 8.4 - 10.5 mg/dL 9.3   Hgb Z7Q MFr Bld 4.6 - 6.5 % 5.6   WBC 4.0 - 10.5 K/uL 9.3  RBC 3.87 - 5.11 Mil/uL 4.76  Platelets 150.0 - 400.0 K/uL 336.0  Hemoglobin 12.0 - 15.0 g/dL 73.4  HCT 19.3 - 79.0 % 38.9  MCV 78.0 - 100.0 fl 81.7  MCHC 30.0 - 36.0 g/dL 24.0    Review of Systems  Constitutional: Negative for chills and fever.  Respiratory: Negative for cough.    Objective:  Physical Exam Exam conducted with a chaperone present.  Constitutional:  Appearance: Normal appearance.  Cardiovascular:  Rate and Rhythm: Normal rate and regular rhythm.  Pulses: Normal pulses.  Heart sounds: Normal heart sounds.  Pulmonary:  Effort: Pulmonary effort is  normal.  Breath sounds: Normal breath sounds.  Musculoskeletal:  Cervical back: Neck supple.  Skin: General: Skin is warm and dry.  Neurological:  Mental Status: She is alert and oriented to person, place, and time.  Psychiatric:  Mood and Affect: Mood normal.  Behavior: Behavior normal.    Assessment:   History of intermittent rectal bleeding, likely anorectal source with stable hemoglobin and description of blood "sprain" with defecation.  Candidate for colon cancer screening.  Plan:   Options for colon cancer screening were reviewed: 1) Cologuard versus 2) colonoscopy. Sensitivity and risks associated with each were reviewed.  The patient thought her father might of had polyp polyps, but review of his 2022 exam showed that none were evident and this was a scheduled 10-year follow-up.  After considering her options she desires to proceed with colonoscopy. She was instructed in regards to preparation by the staff. If indeed internal hemorrhoids were identified we can make arrangements to band these as an office procedure.  This note is partially prepared by Wendall Stade, CMA acting as a scribe in the presence of Dr. Donnalee Curry, MD.   The documentation recorded by the scribe accurately reflects the service I personally performed and the decisions made by me.   Earline Mayotte, MD FACS   Electronically signed by Rosezella Florida, MD at 10/16/2021 2:10 PM EDT

## 2021-11-13 DIAGNOSIS — M545 Low back pain, unspecified: Secondary | ICD-10-CM | POA: Diagnosis not present

## 2021-11-13 DIAGNOSIS — M62830 Muscle spasm of back: Secondary | ICD-10-CM | POA: Diagnosis not present

## 2021-11-18 ENCOUNTER — Ambulatory Visit: Payer: Federal, State, Local not specified - PPO | Admitting: Anesthesiology

## 2021-11-18 ENCOUNTER — Ambulatory Visit
Admission: RE | Admit: 2021-11-18 | Discharge: 2021-11-18 | Disposition: A | Discharge status: Home / Self Care | Payer: Federal, State, Local not specified - PPO | Attending: General Surgery | Admitting: General Surgery

## 2021-11-18 ENCOUNTER — Encounter
Admission: RE | Disposition: A | Discharge status: Home / Self Care | Payer: Self-pay | Source: Home / Self Care | Attending: General Surgery

## 2021-11-18 DIAGNOSIS — K633 Ulcer of intestine: Secondary | ICD-10-CM | POA: Diagnosis not present

## 2021-11-18 DIAGNOSIS — F17210 Nicotine dependence, cigarettes, uncomplicated: Secondary | ICD-10-CM | POA: Insufficient documentation

## 2021-11-18 DIAGNOSIS — K625 Hemorrhage of anus and rectum: Secondary | ICD-10-CM | POA: Diagnosis not present

## 2021-11-18 DIAGNOSIS — K529 Noninfective gastroenteritis and colitis, unspecified: Secondary | ICD-10-CM | POA: Diagnosis not present

## 2021-11-18 DIAGNOSIS — E669 Obesity, unspecified: Secondary | ICD-10-CM | POA: Diagnosis not present

## 2021-11-18 DIAGNOSIS — K649 Unspecified hemorrhoids: Secondary | ICD-10-CM | POA: Diagnosis not present

## 2021-11-18 DIAGNOSIS — F17209 Nicotine dependence, unspecified, with unspecified nicotine-induced disorders: Secondary | ICD-10-CM | POA: Diagnosis not present

## 2021-11-18 DIAGNOSIS — K641 Second degree hemorrhoids: Secondary | ICD-10-CM | POA: Diagnosis not present

## 2021-11-18 HISTORY — PX: COLONOSCOPY WITH PROPOFOL: SHX5780

## 2021-11-18 LAB — POCT PREGNANCY, URINE: Preg Test, Ur: NEGATIVE

## 2021-11-18 SURGERY — COLONOSCOPY WITH PROPOFOL
Anesthesia: General

## 2021-11-18 MED ORDER — PROPOFOL 10 MG/ML IV BOLUS
INTRAVENOUS | Status: DC | PRN
  Administered 2021-11-18: 50 mg via INTRAVENOUS

## 2021-11-18 MED ORDER — LIDOCAINE HCL (CARDIAC) PF 100 MG/5ML IV SOSY
PREFILLED_SYRINGE | INTRAVENOUS | Status: DC | PRN
  Administered 2021-11-18: 50 mg via INTRAVENOUS

## 2021-11-18 MED ORDER — SODIUM CHLORIDE 0.9 % IV SOLN
INTRAVENOUS | Status: DC
  Administered 2021-11-18: 20 mL/h via INTRAVENOUS

## 2021-11-18 MED ORDER — FENTANYL CITRATE (PF) 250 MCG/5ML IJ SOLN
INTRAMUSCULAR | Status: DC | PRN
  Administered 2021-11-18 (×2): 50 ug via INTRAVENOUS

## 2021-11-18 MED ORDER — PROPOFOL 500 MG/50ML IV EMUL
INTRAVENOUS | Status: DC | PRN
  Administered 2021-11-18: 150 ug/kg/min via INTRAVENOUS

## 2021-11-18 MED ORDER — FENTANYL CITRATE (PF) 100 MCG/2ML IJ SOLN
INTRAMUSCULAR | Status: AC
  Filled 2021-11-18: qty 2

## 2021-11-18 NOTE — Anesthesia Preprocedure Evaluation (Signed)
Anesthesia Evaluation  Patient identified by MRN, date of birth, ID band Patient awake    Reviewed: Allergy & Precautions, NPO status , Patient's Chart, lab work & pertinent test results  History of Anesthesia Complications Negative for: history of anesthetic complications  Airway Mallampati: III  TM Distance: <3 FB Neck ROM: full    Dental  (+) Chipped, Poor Dentition   Pulmonary neg shortness of breath, Current Smoker and Patient abstained from smoking.,    Pulmonary exam normal        Cardiovascular Exercise Tolerance: Good (-) anginanegative cardio ROS Normal cardiovascular exam     Neuro/Psych  Headaches, PSYCHIATRIC DISORDERS    GI/Hepatic negative GI ROS, Neg liver ROS, neg GERD  ,  Endo/Other  negative endocrine ROS  Renal/GU negative Renal ROS  negative genitourinary   Musculoskeletal   Abdominal   Peds  Hematology negative hematology ROS (+)   Anesthesia Other Findings Past Medical History: No date: Genital warts No date: Migraines No date: Syncope  No past surgical history on file.  BMI    Body Mass Index: 35.94 kg/m      Reproductive/Obstetrics negative OB ROS                             Anesthesia Physical Anesthesia Plan  ASA: 2  Anesthesia Plan: General   Post-op Pain Management:    Induction: Intravenous  PONV Risk Score and Plan: Propofol infusion and TIVA  Airway Management Planned: Natural Airway and Nasal Cannula  Additional Equipment:   Intra-op Plan:   Post-operative Plan:   Informed Consent: I have reviewed the patients History and Physical, chart, labs and discussed the procedure including the risks, benefits and alternatives for the proposed anesthesia with the patient or authorized representative who has indicated his/her understanding and acceptance.     Dental Advisory Given  Plan Discussed with: Anesthesiologist, CRNA and  Surgeon  Anesthesia Plan Comments: (Patient consented for risks of anesthesia including but not limited to:  - adverse reactions to medications - risk of airway placement if required - damage to eyes, teeth, lips or other oral mucosa - nerve damage due to positioning  - sore throat or hoarseness - Damage to heart, brain, nerves, lungs, other parts of body or loss of life  Patient voiced understanding.)        Anesthesia Quick Evaluation

## 2021-11-18 NOTE — Transfer of Care (Signed)
Immediate Anesthesia Transfer of Care Note  Patient: Heather Hutchinson  Procedure(s) Performed: COLONOSCOPY WITH PROPOFOL  Patient Location: PACU  Anesthesia Type:General  Level of Consciousness: awake and drowsy  Airway & Oxygen Therapy: Patient Spontanous Breathing  Post-op Assessment: Report given to RN  Post vital signs: Reviewed and stable  Last Vitals:  Vitals Value Taken Time  BP 100/66 11/18/21 0934  Temp 35.8 C 11/18/21 0931  Pulse 78 11/18/21 0934  Resp 19 11/18/21 0934  SpO2 96 % 11/18/21 0934    Last Pain:  Vitals:   11/18/21 0931  TempSrc: Temporal  PainSc: Asleep         Complications: No notable events documented.

## 2021-11-18 NOTE — H&P (Signed)
Heather Hutchinson [id number] [birth year]     HPI: Healthy 46 y/o woman with intermittent bright red rectal bleeding. Likely ano-rectal source. For screening colonoscopy.   Medications Prior to Admission  Medication Sig Dispense Refill Last Dose   CAMILA 0.35 MG tablet Take 1 tablet by mouth daily.  1 11/17/2021   FLUoxetine (PROZAC) 20 MG tablet TAKE 1 TABLET BY MOUTH EVERY DAY FOR DEPRESSION 90 tablet 3 Past Week   No Known Allergies Past Medical History:  Diagnosis Date   Genital warts    Migraines    Syncope    No past surgical history on file. Social History   Socioeconomic History   Marital status: Single    Spouse name: Not on file   Number of children: Not on file   Years of education: Not on file   Highest education level: Not on file  Occupational History   Not on file  Tobacco Use   Smoking status: Every Day    Packs/day: 0.50    Types: Cigarettes   Smokeless tobacco: Never  Substance and Sexual Activity   Alcohol use: No    Alcohol/week: 0.0 standard drinks of alcohol   Drug use: No   Sexual activity: Not on file  Other Topics Concern   Not on file  Social History Narrative   Single.   No children.   Works in IT trainer.   Enjoys spending time with her dogs.    Social Determinants of Health   Financial Resource Strain: Not on file  Food Insecurity: Not on file  Transportation Needs: Not on file  Physical Activity: Not on file  Stress: Not on file  Social Connections: Not on file  Intimate Partner Violence: Not on file   Social History   Social History Narrative   Single.   No children.   Works in IT trainer.   Enjoys spending time with her dogs.      ROS: Negative.     PE: HEENT: Negative. Lungs: Clear. Cardio: RR.    Assessment/Plan:  Proceed with planned endoscopy.   Merrily Pew Devansh Riese 11/18/2021

## 2021-11-18 NOTE — Op Note (Signed)
[facility] Gastroenterology Patient Name: Heather Hutchinson Procedure Date: 11/18/2021 8:46 AM MRN: [id number] Account #: [id number] Date of Birth: Nov 13, 1975 Admit Type: Outpatient Age: 46 Room: [facility] ENDO ROOM 1 Gender: Female Note Status: Finalized Instrument Name: Peds Colonoscope [id number] Procedure:             Colonoscopy Indications:           Anal bleeding Providers:             Earline Mayotte, MD Referring MD:          Doreene Nest (Referring MD) Medicines:             Propofol per Anesthesia Complications:         No immediate complications. Procedure:             Pre-Anesthesia Assessment:                        - Prior to the procedure, a History and Physical was                         performed, and patient medications, allergies and                         sensitivities were reviewed. The patient's tolerance                         of previous anesthesia was reviewed.                        - The risks and benefits of the procedure and the                         sedation options and risks were discussed with the                         patient. All questions were answered and informed                         consent was obtained.                        After obtaining informed consent, the colonoscope was                         passed under direct vision. Throughout the procedure,                         the patient's blood pressure, pulse, and oxygen                         saturations were monitored continuously. The                         colonoscopy was somewhat difficult due to a tortuous                         colon. Successful completion of the procedure was                         aided  by using manual pressure. The patient tolerated                         the procedure well. The quality of the bowel                         preparation was excellent. The Colonoscope was                         introduced through the anus and  advanced to the the                         cecum, identified by appendiceal orifice and ileocecal                         valve. Findings:      Discontinuous areas of nonbleeding ulcerated mucosa with no stigmata of       recent bleeding were present at the ileocecal valve. Biopsies were taken       with a cold forceps for histology.      Non-bleeding internal hemorrhoids were found during retroflexion. The       hemorrhoids were small and Grade II (internal hemorrhoids that prolapse       but reduce spontaneously). Impression:            - Mucosal ulceration. Biopsied.                        - Non-bleeding internal hemorrhoids. Recommendation:        - Telephone endoscopist for pathology results in 1                         week. Earline Mayotte, MD 11/18/2021 9:31:47 AM This report has been signed electronically. Number of Addenda: 0 Note Initiated On: 11/18/2021 8:46 AM Scope Withdrawal Time: 0 hours 14 minutes 46 seconds  Total Procedure Duration: 0 hours 23 minutes 14 seconds  Estimated Blood Loss:  Estimated blood loss was minimal.      [facility]

## 2021-11-18 NOTE — Anesthesia Postprocedure Evaluation (Signed)
Anesthesia Post Note  Patient: Quarry manager  Procedure(s) Performed: COLONOSCOPY WITH PROPOFOL  Patient location during evaluation: Endoscopy Anesthesia Type: General Level of consciousness: awake and alert Pain management: pain level controlled Vital Signs Assessment: post-procedure vital signs reviewed and stable Respiratory status: spontaneous breathing, nonlabored ventilation, respiratory function stable and patient connected to nasal cannula oxygen Cardiovascular status: blood pressure returned to baseline and stable Postop Assessment: no apparent nausea or vomiting Anesthetic complications: no   No notable events documented.   Last Vitals:  Vitals:   11/18/21 0941 11/18/21 0951  BP: 118/70 118/74  Pulse:  78  Resp:    Temp:    SpO2:  100%    Last Pain:  Vitals:   11/18/21 0951  TempSrc:   PainSc: 0-No pain                 Cleda Mccreedy Sherin Murdoch

## 2021-11-18 NOTE — Anesthesia Procedure Notes (Signed)
Date/Time: 11/18/2021 9:00 AM  Performed by: Ginger Carne, CRNAPre-anesthesia Checklist: Patient identified, Emergency Drugs available, Suction available, Patient being monitored and Timeout performed Patient Re-evaluated:Patient Re-evaluated prior to induction Oxygen Delivery Method: Nasal cannula Preoxygenation: Pre-oxygenation with 100% oxygen Induction Type: IV induction

## 2021-11-19 ENCOUNTER — Encounter: Payer: Self-pay | Admitting: General Surgery

## 2021-11-19 LAB — SURGICAL PATHOLOGY

## 2021-12-02 DIAGNOSIS — M545 Low back pain, unspecified: Secondary | ICD-10-CM | POA: Diagnosis not present

## 2022-06-08 DIAGNOSIS — L814 Other melanin hyperpigmentation: Secondary | ICD-10-CM | POA: Diagnosis not present

## 2022-06-08 DIAGNOSIS — D485 Neoplasm of uncertain behavior of skin: Secondary | ICD-10-CM | POA: Diagnosis not present

## 2022-06-08 DIAGNOSIS — L818 Other specified disorders of pigmentation: Secondary | ICD-10-CM | POA: Diagnosis not present

## 2022-06-08 DIAGNOSIS — D2262 Melanocytic nevi of left upper limb, including shoulder: Secondary | ICD-10-CM | POA: Diagnosis not present

## 2022-06-08 DIAGNOSIS — L918 Other hypertrophic disorders of the skin: Secondary | ICD-10-CM | POA: Diagnosis not present

## 2022-06-08 DIAGNOSIS — D225 Melanocytic nevi of trunk: Secondary | ICD-10-CM | POA: Diagnosis not present

## 2022-06-08 DIAGNOSIS — Z1283 Encounter for screening for malignant neoplasm of skin: Secondary | ICD-10-CM | POA: Diagnosis not present

## 2022-06-08 DIAGNOSIS — D2271 Melanocytic nevi of right lower limb, including hip: Secondary | ICD-10-CM | POA: Diagnosis not present

## 2022-06-11 DIAGNOSIS — Z01419 Encounter for gynecological examination (general) (routine) without abnormal findings: Secondary | ICD-10-CM | POA: Diagnosis not present

## 2022-06-11 DIAGNOSIS — Z3041 Encounter for surveillance of contraceptive pills: Secondary | ICD-10-CM | POA: Diagnosis not present

## 2022-06-11 DIAGNOSIS — Z1231 Encounter for screening mammogram for malignant neoplasm of breast: Secondary | ICD-10-CM | POA: Diagnosis not present

## 2022-06-11 DIAGNOSIS — Z6834 Body mass index (BMI) 34.0-34.9, adult: Secondary | ICD-10-CM | POA: Diagnosis not present

## 2022-06-11 LAB — HM MAMMOGRAPHY

## 2022-08-24 ENCOUNTER — Encounter: Payer: Self-pay | Admitting: *Deleted

## 2022-08-24 ENCOUNTER — Encounter: Payer: Self-pay | Admitting: Primary Care

## 2022-08-24 ENCOUNTER — Ambulatory Visit (INDEPENDENT_AMBULATORY_CARE_PROVIDER_SITE_OTHER): Payer: Federal, State, Local not specified - PPO | Admitting: Primary Care

## 2022-08-24 VITALS — BP 118/64 | HR 65 | Temp 98.2°F | Ht 65.0 in | Wt 215.0 lb

## 2022-08-24 DIAGNOSIS — N920 Excessive and frequent menstruation with regular cycle: Secondary | ICD-10-CM | POA: Diagnosis not present

## 2022-08-24 DIAGNOSIS — F331 Major depressive disorder, recurrent, moderate: Secondary | ICD-10-CM

## 2022-08-24 DIAGNOSIS — Z0001 Encounter for general adult medical examination with abnormal findings: Secondary | ICD-10-CM

## 2022-08-24 DIAGNOSIS — Z8249 Family history of ischemic heart disease and other diseases of the circulatory system: Secondary | ICD-10-CM | POA: Diagnosis not present

## 2022-08-24 DIAGNOSIS — R519 Headache, unspecified: Secondary | ICD-10-CM

## 2022-08-24 DIAGNOSIS — R079 Chest pain, unspecified: Secondary | ICD-10-CM | POA: Diagnosis not present

## 2022-08-24 LAB — COMPREHENSIVE METABOLIC PANEL
ALT: 13 U/L (ref 0–35)
AST: 16 U/L (ref 0–37)
Albumin: 4.2 g/dL (ref 3.5–5.2)
Alkaline Phosphatase: 60 U/L (ref 39–117)
BUN: 14 mg/dL (ref 6–23)
CO2: 28 mEq/L (ref 19–32)
Calcium: 9.4 mg/dL (ref 8.4–10.5)
Chloride: 101 mEq/L (ref 96–112)
Creatinine, Ser: 0.78 mg/dL (ref 0.40–1.20)
GFR: 90.52 mL/min (ref >60.00)
Glucose, Bld: 79 mg/dL (ref 70–99)
Potassium: 4 mEq/L (ref 3.5–5.1)
Sodium: 137 mEq/L (ref 135–145)
Total Bilirubin: 0.3 mg/dL (ref 0.2–1.2)
Total Protein: 7.5 g/dL (ref 6.0–8.3)

## 2022-08-24 LAB — LIPID PANEL
Cholesterol: 155 mg/dL (ref 0–200)
HDL: 27.9 mg/dL — ABNORMAL LOW (ref >39.00)
NonHDL: 126.67
Total CHOL/HDL Ratio: 6
Triglycerides: 222 mg/dL — ABNORMAL HIGH (ref 0.0–149.0)
VLDL: 44.4 mg/dL — ABNORMAL HIGH (ref 0.0–40.0)

## 2022-08-24 LAB — CBC
HCT: 40.7 % (ref 36.0–46.0)
Hemoglobin: 13 g/dL (ref 12.0–15.0)
MCHC: 32 g/dL (ref 30.0–36.0)
MCV: 79.2 fl (ref 78.0–100.0)
Platelets: 358 10*3/uL (ref 150.0–400.0)
RBC: 5.14 Mil/uL — ABNORMAL HIGH (ref 3.87–5.11)
RDW: 16.4 % — ABNORMAL HIGH (ref 11.5–15.5)
WBC: 11.7 10*3/uL — ABNORMAL HIGH (ref 4.0–10.5)

## 2022-08-24 LAB — LDL CHOLESTEROL, DIRECT: Direct LDL: 107 mg/dL

## 2022-08-24 NOTE — Patient Instructions (Signed)
Stop by the lab prior to leaving today. I will notify you of your results once received.   You will either be contacted via phone regarding your referral to cardiology, or you may receive a letter on your MyChart portal from our referral team with instructions for scheduling an appointment. Please let us know if you have not been contacted by anyone within two weeks.  You will be contacted via phone regarding your CT scan via phone.  It was a pleasure to see you today!

## 2022-08-24 NOTE — Assessment & Plan Note (Signed)
Controlled.  Continue Camila 0.35 mg daily. Follows with GYN.

## 2022-08-24 NOTE — Assessment & Plan Note (Signed)
In brother, father, and paternal grandfather.  Labs pending today including lipid panel, Lipoprotein A. CT Coronary scan ordered and pending.  Referral placed to cardiology.

## 2022-08-24 NOTE — Assessment & Plan Note (Signed)
Controlled overall. ?Continue to monitor.  ?

## 2022-08-24 NOTE — Assessment & Plan Note (Addendum)
Deteriorated and with irregular use of fluoxetine.   She will resume fluoxetine 20 mg daily as she does feel better. Continue fluoxetine 20 mg daily.

## 2022-08-24 NOTE — Progress Notes (Signed)
Subjective:    Patient ID: Heather Hutchinson, female    DOB: [birth year], 47 y.o.   MRN: [id number]  HPI  Heather Hutchinson is a very pleasant 47 y.o. female who presents today for complete physical and follow up of chronic conditions.  She would also like to discuss chest pain. Symptom onset 2-3 weeks ago with intermittent substernal chest pain. Her pain was initially noticed in bed at night. Yesterday she noticed her chest pain when resting at her desk at work, her pain lasted about 1 hour, describes her pain as a pressure, non radiating.  She does have chronic fatigue, mostly sedentary. She denies snoring.   She denies esophageal burning, exertional chest pain, exertional shortness of breath, chest pain with eating. Her brother passed away from a heart attack at the age of 55.   Immunizations: -Tetanus: Completed in 2017  Diet: Fair diet.  Exercise: No regular exercise.  Eye exam: Completes annually  Dental exam: Completes semi-annually    Pap Smear: Follows with GYN, UTD. Mammogram: March 2024  Colonoscopy: Completed in 2023, due 2033  BP Readings from Last 3 Encounters:  08/24/22 118/64  11/18/21 118/74  06/04/21 110/64       Review of Systems  Constitutional:  Negative for unexpected weight change.  HENT:  Negative for rhinorrhea.   Respiratory:  Negative for cough and shortness of breath.   Cardiovascular:  Positive for chest pain.  Gastrointestinal:  Negative for constipation and diarrhea.  Genitourinary:  Negative for difficulty urinating and menstrual problem.  Musculoskeletal:  Negative for arthralgias and myalgias.  Skin:  Negative for rash.  Allergic/Immunologic: Positive for environmental allergies.  Neurological:  Positive for headaches. Negative for dizziness.  Psychiatric/Behavioral:  The patient is not nervous/anxious.          Past Medical History:  Diagnosis Date   Genital warts    Migraines    Syncope     Social History    Socioeconomic History   Marital status: Single    Spouse name: Not on file   Number of children: Not on file   Years of education: Not on file   Highest education level: Not on file  Occupational History   Not on file  Tobacco Use   Smoking status: Every Day    Packs/day: .5    Types: Cigarettes   Smokeless tobacco: Never  Substance and Sexual Activity   Alcohol use: No    Alcohol/week: 0.0 standard drinks of alcohol   Drug use: No   Sexual activity: Not on file  Other Topics Concern   Not on file  Social History Narrative   Single.   No children.   Works in IT trainer.   Enjoys spending time with her dogs.    Social Determinants of Health   Financial Resource Strain: Not on file  Food Insecurity: Not on file  Transportation Needs: Not on file  Physical Activity: Not on file  Stress: Not on file  Social Connections: Not on file  Intimate Partner Violence: Not on file    Past Surgical History:  Procedure Laterality Date   COLONOSCOPY WITH PROPOFOL N/A 11/18/2021   Procedure: COLONOSCOPY WITH PROPOFOL;  Surgeon: Earline Mayotte, MD;  Location: [facility] ENDOSCOPY;  Service: Endoscopy;  Laterality: N/A;    Family History  Problem Relation Age of Onset   Heart disease Father    Sudden death Brother 72   Heart attack Brother 82   Heart attack Paternal Grandfather 68  No Known Allergies  Current Outpatient Medications on File Prior to Visit  Medication Sig Dispense Refill   CAMILA 0.35 MG tablet Take 1 tablet by mouth daily.  1   FLUoxetine (PROZAC) 20 MG tablet TAKE 1 TABLET BY MOUTH EVERY DAY FOR DEPRESSION 90 tablet 3   No current facility-administered medications on file prior to visit.    BP 118/64   Pulse 65   Temp 98.2 F (36.8 C) (Temporal)   Ht 5\' 5"  (1.651 m)   Wt 215 lb (97.5 kg)   LMP 08/03/2022 (Exact Date)   SpO2 98%   BMI 35.78 kg/m  Objective:   Physical Exam HENT:     Right Ear: Tympanic membrane and ear canal normal.      Left Ear: Tympanic membrane and ear canal normal.     Nose: Nose normal.  Eyes:     Conjunctiva/sclera: Conjunctivae normal.     Pupils: Pupils are equal, round, and reactive to light.  Neck:     Thyroid: No thyromegaly.  Cardiovascular:     Rate and Rhythm: Normal rate and regular rhythm.     Heart sounds: No murmur heard. Pulmonary:     Effort: Pulmonary effort is normal.     Breath sounds: Normal breath sounds. No rales.  Abdominal:     General: Bowel sounds are normal.     Palpations: Abdomen is soft.     Tenderness: There is no abdominal tenderness.  Musculoskeletal:        General: Normal range of motion.     Cervical back: Neck supple.  Lymphadenopathy:     Cervical: No cervical adenopathy.  Skin:    General: Skin is warm and dry.     Findings: No rash.  Neurological:     Mental Status: She is alert and oriented to person, place, and time.     Cranial Nerves: No cranial nerve deficit.     Deep Tendon Reflexes: Reflexes are normal and symmetric.  Psychiatric:        Mood and Affect: Mood normal.           Assessment & Plan:  Encounter for annual general medical examination with abnormal findings in adult Assessment & Plan: Immunizations UTD. Pap smear UTD. Mammogram UTD.  Discussed the importance of a healthy diet and regular exercise in order for weight loss, and to reduce the risk of further co-morbidity.  Exam stable. Labs pending.  Follow up in 1 year for repeat physical.    Moderate episode of recurrent major depressive disorder (HCC) Assessment & Plan: Deteriorated and with irregular use of fluoxetine.   She will resume fluoxetine 20 mg daily as she does feel better. Continue fluoxetine 20 mg daily.   Frequent headaches Assessment & Plan: Controlled overall.  Continue to monitor.    Menorrhagia with regular cycle Assessment & Plan: Controlled.  Continue Camila 0.35 mg daily. Follows with GYN.   Chest pain, unspecified  type Assessment & Plan: Unclear etiology, doesn't appear to be GERD. Lower suspicion for PE, but she is managed on progesterone daily.  ECG today with NSR, rate of 66. No PAC/PVC, acute ST changes.  Labs pending today including D-dimer, CBC, CMP, A1C, Lipids, Lipoprotein A.  CT coronary scan ordered and pending. Referral placed to cardiology for further evaluation.   Orders: -     EKG 12-Lead -     Lipid panel -     Hemoglobin A1c -     Comprehensive metabolic panel -  CBC -     Lipoprotein A (LPA) -     Ambulatory referral to Cardiology  Family history of early CAD Assessment & Plan: In brother, father, and paternal grandfather.  Labs pending today including lipid panel, Lipoprotein A. CT Coronary scan ordered and pending.  Referral placed to cardiology.   Orders: -     CT CARDIAC SCORING (SELF PAY ONLY); Future -     Lipid panel -     Lipoprotein A (LPA)        Doreene Nest, NP

## 2022-08-24 NOTE — Assessment & Plan Note (Signed)
Immunizations UTD. Pap smear UTD. Mammogram UTD.  Discussed the importance of a healthy diet and regular exercise in order for weight loss, and to reduce the risk of further co-morbidity.  Exam stable. Labs pending.  Follow up in 1 year for repeat physical.' 

## 2022-08-24 NOTE — Assessment & Plan Note (Signed)
Unclear etiology, doesn't appear to be GERD. Lower suspicion for PE, but she is managed on progesterone daily.  ECG today with NSR, rate of 66. No PAC/PVC, acute ST changes.  Labs pending today including D-dimer, CBC, CMP, A1C, Lipids, Lipoprotein A.  CT coronary scan ordered and pending. Referral placed to cardiology for further evaluation.

## 2022-08-25 LAB — HEMOGLOBIN A1C: Hgb A1c MFr Bld: 5.8 % (ref 4.6–6.5)

## 2022-08-27 ENCOUNTER — Encounter: Payer: Federal, State, Local not specified - PPO | Admitting: Primary Care

## 2022-08-31 LAB — LIPOPROTEIN A (LPA): Lipoprotein (a): 17 nmol/L (ref <75)

## 2022-09-01 ENCOUNTER — Ambulatory Visit
Admission: RE | Admit: 2022-09-01 | Discharge: 2022-09-01 | Disposition: A | Discharge status: Home / Self Care | Payer: Federal, State, Local not specified - PPO | Source: Ambulatory Visit | Attending: Primary Care | Admitting: Primary Care

## 2022-09-01 DIAGNOSIS — Z8249 Family history of ischemic heart disease and other diseases of the circulatory system: Secondary | ICD-10-CM | POA: Insufficient documentation

## 2022-09-16 ENCOUNTER — Other Ambulatory Visit: Payer: Self-pay | Admitting: Primary Care

## 2022-09-16 DIAGNOSIS — F331 Major depressive disorder, recurrent, moderate: Secondary | ICD-10-CM

## 2023-02-18 DIAGNOSIS — L578 Other skin changes due to chronic exposure to nonionizing radiation: Secondary | ICD-10-CM | POA: Diagnosis not present

## 2023-02-18 DIAGNOSIS — L02224 Furuncle of groin: Secondary | ICD-10-CM | POA: Diagnosis not present

## 2023-02-18 DIAGNOSIS — B9689 Other specified bacterial agents as the cause of diseases classified elsewhere: Secondary | ICD-10-CM | POA: Diagnosis not present

## 2023-11-02 DIAGNOSIS — F17210 Nicotine dependence, cigarettes, uncomplicated: Secondary | ICD-10-CM | POA: Diagnosis not present

## 2023-11-02 DIAGNOSIS — Z01419 Encounter for gynecological examination (general) (routine) without abnormal findings: Secondary | ICD-10-CM | POA: Diagnosis not present

## 2023-11-02 DIAGNOSIS — Z1331 Encounter for screening for depression: Secondary | ICD-10-CM | POA: Diagnosis not present

## 2023-11-02 DIAGNOSIS — Z1231 Encounter for screening mammogram for malignant neoplasm of breast: Secondary | ICD-10-CM | POA: Diagnosis not present

## 2023-11-02 DIAGNOSIS — Z124 Encounter for screening for malignant neoplasm of cervix: Secondary | ICD-10-CM | POA: Diagnosis not present

## 2023-11-02 LAB — HM MAMMOGRAPHY
